# Patient Record
Sex: Female | Born: 1960 | Hispanic: No | Marital: Married | Smoking: Current every day smoker
Health system: Southern US, Community
[De-identification: ages and names within clinical notes are randomized; demographics above are authoritative.]

## PROBLEM LIST (undated history)

## (undated) DIAGNOSIS — M8580 Other specified disorders of bone density and structure, unspecified site: Secondary | ICD-10-CM

## (undated) DIAGNOSIS — E079 Disorder of thyroid, unspecified: Secondary | ICD-10-CM

## (undated) DIAGNOSIS — E785 Hyperlipidemia, unspecified: Secondary | ICD-10-CM

## (undated) DIAGNOSIS — E119 Type 2 diabetes mellitus without complications: Secondary | ICD-10-CM

## (undated) DIAGNOSIS — D649 Anemia, unspecified: Secondary | ICD-10-CM

## (undated) DIAGNOSIS — M1990 Unspecified osteoarthritis, unspecified site: Secondary | ICD-10-CM

## (undated) HISTORY — DX: Unspecified osteoarthritis, unspecified site: M19.90

## (undated) HISTORY — DX: Disorder of thyroid, unspecified: E07.9

## (undated) HISTORY — PX: CERVICAL SPINE SURGERY: SHX589

## (undated) HISTORY — DX: Type 2 diabetes mellitus without complications: E11.9

## (undated) HISTORY — DX: Other specified disorders of bone density and structure, unspecified site: M85.80

## (undated) HISTORY — DX: Hyperlipidemia, unspecified: E78.5

## (undated) HISTORY — DX: Anemia, unspecified: D64.9

---

## 1985-06-14 HISTORY — PX: BUNIONECTOMY: SHX129

## 2012-07-20 LAB — COMPLETE METABOLIC PANEL WITH GFR
ALBUMIN/GLOB SERPL: 1.5
ALT: 10 U/L (ref 7–35)
AST: 16 U/L
Albumin: 4.4
Alkaline Phosphatase: 99 U/L
BASOS ABS: 47 /uL
BILIRUBIN, TOTAL: 0.3
BUN: 10 mg/dL (ref 4–21)
Baso % Manual: 0.6
CHLORIDE: 108 mmol/L
CO2: 24 mmol/L
Calcium: 9.2 mg/dL
Chol/HDL Ratio: 3.3
Creat: 0.75
EOSINOPHIL PERCENT: 1.4
Eosinophils Absolute: 111 /uL
GFR, EST AFRICAN AMERICAN: 108
GFR, EST NON AFRICAN AMERICAN: 94
Globulin: 2.9
Glucose: 117
HEMOGLOBIN A1C: 7.4 % — AB (ref 4.0–6.0)
Lymphocytes relative %: 34 % (ref 15–45)
Monocytes relative %: 4.2 % (ref 2–10)
NONHDL CHOLESTEROL: 126
Neutrophils relative % (GR): 59.8 % (ref 44–76)
POTASSIUM: 4.7 mmol/L
Sodium: 141 mmol/L (ref 137–147)
TOTAL PROTEIN: 7.3 g/dL

## 2012-07-20 LAB — CBC WITH DIFFERENTIAL
HCT: 31 %
Hemoglobin: 10.1 g/dL — AB (ref 11.8–15.5)
LYMPHS ABS: 2686
MCH: 25.8
MCHC: 32.5
MCV: 79.3 fL (ref 78–100)
Monocytes(Absolute): 332
NEUTROS ABS: 4724
PLATELET COUNT: 181
RBC: 3.94
RDW: 17.7
WBC: 7.9

## 2012-07-20 LAB — URINALYSIS, COMPLETE
BACTERIA: NONE SEEN
BILIRUBIN: NEGATIVE
Glucose: NEGATIVE
HYALINE CASTS UA: NONE SEEN
Ketones: NEGATIVE
Leukocyte Esterase: NEGATIVE
Nitrite: NEGATIVE
Occult Blood: NEGATIVE
PH: 6.5
Protein: NEGATIVE
SPECIFIC GRAVITY: 1.007
SQUAM EPITHEL UA: NONE SEEN
WBC: NONE SEEN

## 2012-07-20 LAB — LIPID PANEL
CHOLESTEROL, TOTAL: 181
HDL: 55 mg/dL (ref 35–70)
LDL Cholesterol: 108 mg/dL
Magnesium: 2.1 mg/dL (ref 1.6–2.4)
TRIGLYCERIDES: 89
TSH: 2.34
Vitamin D, 25-OH, Total: 11

## 2012-07-20 LAB — ALBUMIN, URINE, RANDOM
Creatinine Random, Urine: 43
MICROALB/CREAT RATIO: 5
MICROALBUM., U, RANDOM: 0.2

## 2014-01-15 ENCOUNTER — Telehealth: Payer: Self-pay | Admitting: *Deleted

## 2014-01-15 ENCOUNTER — Encounter: Payer: Self-pay | Admitting: Nurse Practitioner

## 2014-01-15 ENCOUNTER — Ambulatory Visit (INDEPENDENT_AMBULATORY_CARE_PROVIDER_SITE_OTHER): Payer: Medicare Other | Admitting: Nurse Practitioner

## 2014-01-15 VITALS — BP 112/80 | HR 64 | Temp 97.9°F | Ht 65.0 in | Wt 158.0 lb

## 2014-01-15 DIAGNOSIS — D638 Anemia in other chronic diseases classified elsewhere: Secondary | ICD-10-CM | POA: Insufficient documentation

## 2014-01-15 DIAGNOSIS — M150 Primary generalized (osteo)arthritis: Secondary | ICD-10-CM

## 2014-01-15 DIAGNOSIS — M79609 Pain in unspecified limb: Secondary | ICD-10-CM

## 2014-01-15 DIAGNOSIS — M79604 Pain in right leg: Secondary | ICD-10-CM | POA: Insufficient documentation

## 2014-01-15 DIAGNOSIS — Z0000 Encounter for general adult medical examination without abnormal findings: Secondary | ICD-10-CM

## 2014-01-15 DIAGNOSIS — E559 Vitamin D deficiency, unspecified: Secondary | ICD-10-CM

## 2014-01-15 DIAGNOSIS — G589 Mononeuropathy, unspecified: Secondary | ICD-10-CM

## 2014-01-15 DIAGNOSIS — E538 Deficiency of other specified B group vitamins: Secondary | ICD-10-CM

## 2014-01-15 DIAGNOSIS — M545 Low back pain, unspecified: Secondary | ICD-10-CM

## 2014-01-15 DIAGNOSIS — M79605 Pain in left leg: Secondary | ICD-10-CM | POA: Insufficient documentation

## 2014-01-15 DIAGNOSIS — E039 Hypothyroidism, unspecified: Secondary | ICD-10-CM

## 2014-01-15 DIAGNOSIS — E119 Type 2 diabetes mellitus without complications: Secondary | ICD-10-CM

## 2014-01-15 DIAGNOSIS — G629 Polyneuropathy, unspecified: Secondary | ICD-10-CM

## 2014-01-15 DIAGNOSIS — M159 Polyosteoarthritis, unspecified: Secondary | ICD-10-CM

## 2014-01-15 LAB — IRON AND TIBC
%SAT: 33 % (ref 20–55)
Iron: 116 ug/dL (ref 42–145)
TIBC: 347 ug/dL (ref 250–470)
UIBC: 231 ug/dL (ref 125–400)

## 2014-01-15 MED ORDER — METFORMIN HCL 850 MG PO TABS: 850.0000 mg | ORAL_TABLET | Freq: Three times a day (TID) | ORAL | Status: DC

## 2014-01-15 MED ORDER — TIZANIDINE HCL 2 MG PO TABS: ORAL_TABLET | ORAL | Status: DC

## 2014-01-15 MED ORDER — HYDROCODONE-ACETAMINOPHEN 5-325 MG PO TABS: 1.0000 | ORAL_TABLET | Freq: Three times a day (TID) | ORAL | Status: DC

## 2014-01-15 MED ORDER — GABAPENTIN (ONCE-DAILY) 300 MG PO TABS: 300.0000 mg | ORAL_TABLET | Freq: Every day | ORAL | Status: DC

## 2014-01-15 MED ORDER — CELECOXIB 200 MG PO CAPS: 200.0000 mg | ORAL_CAPSULE | Freq: Every day | ORAL | Status: DC

## 2014-01-15 NOTE — Patient Instructions (Addendum)
Please see pain management. I will refer to vascular surgeon once I receive records. Start celebrex daily for arthritis pain.  My office will call with lab results. I will refill thyroid medicine & B12 once I receive lab results.  Please return in 2 to 3 weeks.  Nice to meet you!

## 2014-01-15 NOTE — Progress Notes (Signed)
Pre visit review using our clinic review tool, if applicable. No additional management support is needed unless otherwise documented below in the visit note. 

## 2014-01-16 LAB — CBC WITH DIFFERENTIAL/PLATELET
Basophils Absolute: 0 10*3/uL (ref 0.0–0.1)
Basophils Relative: 0.2 % (ref 0.0–3.0)
EOS ABS: 0.1 10*3/uL (ref 0.0–0.7)
Eosinophils Relative: 1.6 % (ref 0.0–5.0)
HCT: 41.2 % (ref 36.0–46.0)
Hemoglobin: 14.2 g/dL (ref 12.0–15.0)
Lymphocytes Relative: 30.1 % (ref 12.0–46.0)
Lymphs Abs: 2.3 10*3/uL (ref 0.7–4.0)
MCHC: 34.5 g/dL (ref 30.0–36.0)
MCV: 95 fl (ref 78.0–100.0)
MONO ABS: 0.3 10*3/uL (ref 0.1–1.0)
Monocytes Relative: 3.6 % (ref 3.0–12.0)
NEUTROS PCT: 64.5 % (ref 43.0–77.0)
Neutro Abs: 4.9 10*3/uL (ref 1.4–7.7)
Platelets: 181 10*3/uL (ref 150.0–400.0)
RBC: 4.33 Mil/uL (ref 3.87–5.11)
RDW: 12.4 % (ref 11.5–15.5)
WBC: 7.7 10*3/uL (ref 4.0–10.5)

## 2014-01-16 LAB — LIPID PANEL
Cholesterol: 206 mg/dL — ABNORMAL HIGH (ref 0–200)
HDL: 51.2 mg/dL (ref >39.00)
LDL Cholesterol: 134 mg/dL — ABNORMAL HIGH (ref 0–99)
NonHDL: 154.8
Total CHOL/HDL Ratio: 4
Triglycerides: 106 mg/dL (ref 0.0–149.0)
VLDL: 21.2 mg/dL (ref 0.0–40.0)

## 2014-01-16 LAB — FERRITIN: Ferritin: 17 ng/mL (ref 10.0–291.0)

## 2014-01-16 LAB — HEMOGLOBIN A1C: HEMOGLOBIN A1C: 8 % — AB (ref 4.6–6.5)

## 2014-01-16 LAB — COMPREHENSIVE METABOLIC PANEL
ALT: 59 U/L — ABNORMAL HIGH (ref 0–35)
AST: 36 U/L (ref 0–37)
Albumin: 4.3 g/dL (ref 3.5–5.2)
Alkaline Phosphatase: 86 U/L (ref 39–117)
BUN: 11 mg/dL (ref 6–23)
CHLORIDE: 104 meq/L (ref 96–112)
CO2: 28 meq/L (ref 19–32)
Calcium: 9.3 mg/dL (ref 8.4–10.5)
Creatinine, Ser: 0.7 mg/dL (ref 0.4–1.2)
GFR: 88.03 mL/min (ref >60.00)
Glucose, Bld: 155 mg/dL — ABNORMAL HIGH (ref 70–99)
Potassium: 4.3 mEq/L (ref 3.5–5.1)
SODIUM: 138 meq/L (ref 135–145)
TOTAL PROTEIN: 7.1 g/dL (ref 6.0–8.3)
Total Bilirubin: 0.8 mg/dL (ref 0.2–1.2)

## 2014-01-16 LAB — VITAMIN D 25 HYDROXY (VIT D DEFICIENCY, FRACTURES): VITD: 53.84 ng/mL (ref 30.00–100.00)

## 2014-01-16 LAB — VITAMIN B12: Vitamin B-12: 865 pg/mL (ref 211–911)

## 2014-01-16 LAB — TSH: TSH: 0.68 u[IU]/mL (ref 0.35–4.50)

## 2014-01-17 ENCOUNTER — Encounter: Payer: Self-pay | Admitting: Nurse Practitioner

## 2014-01-17 DIAGNOSIS — M549 Dorsalgia, unspecified: Secondary | ICD-10-CM | POA: Insufficient documentation

## 2014-01-17 DIAGNOSIS — Z0000 Encounter for general adult medical examination without abnormal findings: Secondary | ICD-10-CM | POA: Insufficient documentation

## 2014-01-17 NOTE — Assessment & Plan Note (Addendum)
Takes Norco 5/325 1T tid, gabapentin 300 mg qhs, & tizanadine 2md 1 1/2 T tid. Will prescribe 30 days. Refer to pain management.

## 2014-01-17 NOTE — Assessment & Plan Note (Signed)
Check B12 today. 

## 2014-01-17 NOTE — Assessment & Plan Note (Signed)
Continue meds. A1C, lipids, Cmet today.

## 2014-01-17 NOTE — Progress Notes (Signed)
Subjective:     Shelia Leon is a 53 y.o. female presents to establish care. She recently re-located from [place]. She is currently treated for DM, hypothyroidism, chronic pain & neuropathy, anemia, hyperlipidemia, and she is a smoker. She brings an OV note from PCP dated 09/28/2013, labs dated 07/20/2012, and colonoscopy report dated 07/02/13. All notes reviewed & Copied, originals returned to patient.  Shelia Leon walks with the aid of a cane. She is disabled. She has no new complaints today, wishes to resume care for chronic conditions.  The following portions of the patient's history were reviewed and updated as appropriate: allergies, current medications, past medical history, past social history, past surgical history and problem list.  Review of Systems Pertinent items are noted in HPI.    Objective:    BP 112/80  Pulse 64  Temp(Src) 97.9 F (36.6 C) (Oral)  Ht 5\' 5"  (1.651 m)  Wt 158 lb (71.668 kg)  BMI 26.29 kg/m2  SpO2 96%  LMP 01/15/2013 BP 112/80  Pulse 64  Temp(Src) 97.9 F (36.6 C) (Oral)  Ht 5\' 5"  (1.651 m)  Wt 158 lb (71.668 kg)  BMI 26.29 kg/m2  SpO2 96%  LMP 01/15/2013 General appearance: alert, cooperative, appears stated age, no distress and walks with cane Head: Normocephalic, without obvious abnormality, atraumatic Eyes: negative findings: lids and lashes normal and conjunctivae and sclerae normal Ears: normal TM's and external ear canals both ears Throat: lips, mucosa, and tongue normal; teeth and gums normal Lungs: clear to auscultation bilaterally Heart: regular rate and rhythm, S1, S2 normal, no murmur, click, rub or gallop Extremities: extremities normal, atraumatic, no cyanosis or edema Pulses: 2+ and symmetric    Assessment:   1. Unspecified vitamin D deficiency - Vit D  25 hydroxy (rtn osteoporosis monitoring)  2. Unspecified hypothyroidism - TSH  3. Type 2 diabetes mellitus without complication - Hemoglobin A1c - Lipid panel - Comprehensive  metabolic panel - metFORMIN (GLUCOPHAGE) 850 MG tablet; Take 1 tablet (850 mg total) by mouth 3 (three) times daily.  Dispense: 90 tablet; Refill: 1  4. Anemia, chronic disease - CBC with Differential - Ferritin - Iron and TIBC  5. B12 deficiency - Vitamin B12  6. Neuropathy - tiZANidine (ZANAFLEX) 2 MG tablet; Take 1 to 2 Tablets PO 3 tid PRN  Dispense: 180 tablet; Refill: 0 - Gabapentin, PHN, 300 MG TABS; Take 300 mg by mouth at bedtime.  Dispense: 30 tablet; Refill: 1 - HYDROcodone-acetaminophen (NORCO/VICODIN) 5-325 MG per tablet; Take 1 tablet by mouth 3 (three) times daily.  Dispense: 90 tablet; Refill: 0 - Ambulatory referral to Pain Clinic  7. Primary osteoarthritis involving multiple joints - celecoxib (CELEBREX) 200 MG capsule; Take 1 capsule (200 mg total) by mouth daily.  Dispense: 30 capsule; Refill: 1  8. Leg pain, bilateral  9. Bilateral low back pain, with sciatica presence unspecified  See problem list for complete A&P See pt instructions. F/u 1 mo.-celebrex.

## 2014-01-17 NOTE — Assessment & Plan Note (Signed)
TSH today. Continue meds.

## 2014-01-17 NOTE — Assessment & Plan Note (Signed)
Will prescribe 30 days worth of above meds. Ref to pain management.

## 2014-01-18 ENCOUNTER — Other Ambulatory Visit: Payer: Self-pay | Admitting: Nurse Practitioner

## 2014-01-18 ENCOUNTER — Telehealth: Payer: Self-pay | Admitting: *Deleted

## 2014-01-18 DIAGNOSIS — E039 Hypothyroidism, unspecified: Secondary | ICD-10-CM

## 2014-01-18 MED ORDER — LEVOTHYROXINE SODIUM 125 MCG PO TABS: 125.0000 ug | ORAL_TABLET | Freq: Every day | ORAL | Status: DC

## 2014-01-18 NOTE — Telephone Encounter (Signed)
Patient called office requesting her lab results. Please advise?

## 2014-01-18 NOTE — Telephone Encounter (Signed)
pls call pt: Advise No anemia. No change in thyroid meds. Will refill med. A1C increased to 8.0 from 7.4.  Make OV within week to discuss diet changes or more meds. Liver enzyme slightly elevated. Vit D & B12 good. All other labs nml.

## 2014-01-18 NOTE — Telephone Encounter (Signed)
Patient notified of lab results. Patient expressed understanding. Scheduled appt for 01/21/14.

## 2014-01-21 ENCOUNTER — Encounter: Payer: Self-pay | Admitting: Nurse Practitioner

## 2014-01-21 ENCOUNTER — Ambulatory Visit (INDEPENDENT_AMBULATORY_CARE_PROVIDER_SITE_OTHER): Payer: Medicare Other | Admitting: Nurse Practitioner

## 2014-01-21 VITALS — BP 113/70 | HR 66 | Temp 97.6°F | Ht 65.0 in | Wt 158.0 lb

## 2014-01-21 DIAGNOSIS — E119 Type 2 diabetes mellitus without complications: Secondary | ICD-10-CM

## 2014-01-21 DIAGNOSIS — M150 Primary generalized (osteo)arthritis: Secondary | ICD-10-CM

## 2014-01-21 DIAGNOSIS — M159 Polyosteoarthritis, unspecified: Secondary | ICD-10-CM

## 2014-01-21 DIAGNOSIS — E538 Deficiency of other specified B group vitamins: Secondary | ICD-10-CM

## 2014-01-21 DIAGNOSIS — D638 Anemia in other chronic diseases classified elsewhere: Secondary | ICD-10-CM

## 2014-01-21 DIAGNOSIS — M2550 Pain in unspecified joint: Secondary | ICD-10-CM

## 2014-01-21 MED ORDER — NAPROXEN 500 MG PO TABS: 500.0000 mg | ORAL_TABLET | Freq: Every day | ORAL | Status: DC

## 2014-01-21 NOTE — Progress Notes (Signed)
Subjective:     Shelia Leon is an 53 y.o. female who presents for follow up of diabetes. Last A1C increased to 8.0 from 7.4. Current symptoms include: none. Patient denies foot ulcerations, hyperglycemia, hypoglycemia , increased appetite, nausea, polydipsia and polyuria. Evaluation to date has included: hemoglobin A1C. Current treatments: 850 mg tid metformin which has been ineffective. Last dilated eye exam within year. Lengthy discussion about diet & exercise changes. Pt requests handicap plate & plaquard. Forms filled out. Reviewed recent labs: elevated ALT, B12, vit D.  The following portions of the patient's history were reviewed and updated as appropriate: allergies, current medications, past family history, past medical history, past social history, past surgical history and problem list.  Review of Systems Pertinent items are noted in HPI.    Objective:    BP 113/70  Pulse 66  Temp(Src) 97.6 F (36.4 C) (Temporal)  Ht 5\' 5"  (1.651 m)  Wt 158 lb (71.668 kg)  BMI 26.29 kg/m2  SpO2 98%  LMP 01/15/2013 General appearance: alert, cooperative, appears stated age and no distress Head: Normocephalic, without obvious abnormality, atraumatic Eyes: negative findings: lids and lashes normal and conjunctivae and sclerae normal  Laboratory: No components found with this basename: A1C      Assessment:   1. Arthralgia - naproxen (NAPROSYN) 500 MG tablet; Take 1 tablet (500 mg total) by mouth daily with lunch.  Dispense: 30 tablet; Refill: 2  2. Vitamin B 12 deficiency - Vitamin B12; Future  3. Type 2 diabetes mellitus without complication  4. Primary osteoarthritis involving multiple joints  5. Anemia, chronic disease Resolved. 6. B12 deficiency  See problem list for complete A&P See pt instructions. Lab appt 3 wks for B12. F/u OV 3 mos.

## 2014-01-21 NOTE — Assessment & Plan Note (Signed)
Last inj 1 week ago. Trial with oral b12 only. Check b12 in 3 weeks. If not dropped, use oral b12 2400 mcg qd., o/w will continue injections.

## 2014-01-21 NOTE — Assessment & Plan Note (Signed)
Start naproxen 500 mg qd w/lunch. F/u 3 mos.

## 2014-01-21 NOTE — Assessment & Plan Note (Signed)
Diet changes: no corn syrup & high fructose corn syrups. Make tea at home, sweeten w/splenda. No coffee creamers-use splenda & milk. Whole grains only-must have 4 gm fiber or more /srvng. No refined sugars. Meat 3-4 times/wk. Eat fruits & vegetables with every meal. Walk 10 minutes after every meal. A1C in 3 mos.

## 2014-01-21 NOTE — Patient Instructions (Signed)
Cut out refined sugar:anything that is sweet when you eat or drink it except fresh fruit. No corn syrup or high fructose corn syrups.  Cut out white bread, rolls, biscuits, bagels, muffins, pasta and cereals. Breads & cereals that have 4 gm or more of fiber per serving are good. Whole wheat pasta, brown rice and quinoa are good choices. Roasted potatoes with peeling are good. Make your own tea, add fresh lemon juice, use splenda to sweeten. Avoid coffee creamers-use milk & splenda instead. Eat fruits & vegetables with every meal. Limit meat to 3-4 times/week.  Walk or move (dance, bike, treadmill) for at least 10 minutes after every meal.  Make these changes for 3 mos & I will check A1C again. Hopefully you can avoid more medicine.  Decrease Vitamin D to every other day.  Stop B12 injections, return in 3 weeks to check level, you may be able to take B12 tabs only. You should get 2400 mcg or 2 mg daily.  Start naproxen with food daily.

## 2014-01-21 NOTE — Progress Notes (Signed)
Pre visit review using our clinic review tool, if applicable. No additional management support is needed unless otherwise documented below in the visit note. 

## 2014-01-21 NOTE — Assessment & Plan Note (Signed)
Anemia is corrected. Reason unknown for historical anemia: possibly B12 deficiency.  Iron studies nml.

## 2014-01-22 ENCOUNTER — Encounter: Payer: Self-pay | Admitting: *Deleted

## 2014-01-22 NOTE — Telephone Encounter (Signed)
Started PA for gabapentin phn, 300 mg 1 po qhs.

## 2014-02-07 ENCOUNTER — Other Ambulatory Visit (INDEPENDENT_AMBULATORY_CARE_PROVIDER_SITE_OTHER): Payer: Medicare Other

## 2014-02-07 ENCOUNTER — Telehealth: Payer: Self-pay

## 2014-02-07 ENCOUNTER — Other Ambulatory Visit: Payer: Medicare Other

## 2014-02-07 ENCOUNTER — Ambulatory Visit: Payer: Medicare Other | Admitting: Nurse Practitioner

## 2014-02-07 DIAGNOSIS — E538 Deficiency of other specified B group vitamins: Secondary | ICD-10-CM

## 2014-02-07 LAB — VITAMIN B12: Vitamin B-12: 856 pg/mL (ref 211–911)

## 2014-02-07 NOTE — Telephone Encounter (Signed)
Pt came in for bloodwork and wanted to know if Layne could write her a letter for her apartment. She wants it to say that she needs a low level apartment with assisting bars and a high toilet due to her medical condition. Please advise.

## 2014-02-11 ENCOUNTER — Encounter: Payer: Self-pay | Admitting: Nurse Practitioner

## 2014-02-11 NOTE — Telephone Encounter (Signed)
pls draft letter  "to Whom it May Concern"  Stating "Due to medical condition that places Ms. Bateson at risk for falls, 1st level apartment with high toilet seat and grab bar at toilet & tub are necessary to minimize risk for falls."  Please have me sign ,then mail to patient.

## 2014-02-11 NOTE — Telephone Encounter (Signed)
Letter done

## 2014-02-11 NOTE — Telephone Encounter (Signed)
Letter mailed to pt residence

## 2014-02-12 ENCOUNTER — Encounter: Payer: Self-pay | Admitting: Nurse Practitioner

## 2014-02-20 ENCOUNTER — Telehealth: Payer: Self-pay | Admitting: Nurse Practitioner

## 2014-02-20 NOTE — Telephone Encounter (Signed)
Patient is inquiring about a referral to a vascular surgeon. Patient also wants to know when she should do a follow-up visit at our office.

## 2014-02-21 NOTE — Telephone Encounter (Signed)
Left message for pt to call back  °

## 2014-02-21 NOTE — Telephone Encounter (Signed)
I reviewed her records. There were no vascular studies, only nerve-conduction velocity testing. Does she wasn't to see a neurologist? She should have f/u visit early November, Pls schedule.

## 2014-02-22 ENCOUNTER — Telehealth: Payer: Self-pay

## 2014-02-22 NOTE — Telephone Encounter (Signed)
Pt wants the referral to the neurologist.

## 2014-02-22 NOTE — Telephone Encounter (Signed)
Spoke with pt, she states she needs a Physiological scientist and feels frustrated that she had a procedure planned in the city where she moved from and now she is unable to proceed with it. She is going to make an appt with [place] at [facility].

## 2014-02-26 ENCOUNTER — Other Ambulatory Visit: Payer: Self-pay | Admitting: Nurse Practitioner

## 2014-02-26 DIAGNOSIS — G5793 Unspecified mononeuropathy of bilateral lower limbs: Secondary | ICD-10-CM

## 2014-02-26 NOTE — Progress Notes (Signed)
Patient aware of neurology referral.

## 2014-03-15 ENCOUNTER — Ambulatory Visit (INDEPENDENT_AMBULATORY_CARE_PROVIDER_SITE_OTHER): Payer: Medicare Other | Admitting: Neurology

## 2014-03-15 ENCOUNTER — Encounter: Payer: Self-pay | Admitting: Neurology

## 2014-03-15 VITALS — BP 110/70 | HR 53 | Ht 65.0 in | Wt 158.6 lb

## 2014-03-15 DIAGNOSIS — E1149 Type 2 diabetes mellitus with other diabetic neurological complication: Secondary | ICD-10-CM

## 2014-03-15 DIAGNOSIS — R269 Unspecified abnormalities of gait and mobility: Secondary | ICD-10-CM

## 2014-03-15 DIAGNOSIS — Z720 Tobacco use: Secondary | ICD-10-CM

## 2014-03-15 DIAGNOSIS — F17200 Nicotine dependence, unspecified, uncomplicated: Secondary | ICD-10-CM

## 2014-03-15 DIAGNOSIS — G894 Chronic pain syndrome: Secondary | ICD-10-CM

## 2014-03-15 MED ORDER — GABAPENTIN 300 MG PO CAPS: 300.0000 mg | ORAL_CAPSULE | Freq: Three times a day (TID) | ORAL | Status: DC

## 2014-03-15 NOTE — Progress Notes (Signed)
[facility] Neurology [facility] Clinic Note - Initial Visit   Date: 03/15/2014  Shelia Leon MRN: [id number] DOB: 18-Nov-1960   Dear Maximino Sarin, NP:  Thank you for your kind referral of Shelia Leon for consultation of neuropathy. Although her history is well known to you, please allow [place] to reiterate it for the purpose of our medical record. The patient was accompanied to the clinic by husband who also provides collateral information.     History of Present Illness: Shelia Leon is a 53 y.o. right-handed Caucasian female with history of diabetic neuropathy (HbA1c 8.0, diagnosed 2001), hyperlipidemia, hypothyroidism, cervical spondylosis s/p anterior decompression and fusion, current tobacco use, and vitamin B12 deficiency presenting for evaluation of neuropathy.    In 2011, she was hospitalized because of behavior changes described as not eating, not talking, combative, "unresponsive", but no loss of consciousness.  She does not recall the details well, but reports she was "even eating flowers" She reports not getting out of the bed for 3 weeks and was eventually discharged to in-patient rehab and was discharged with a walker.  After completing out-patient therapy, she used a cane which she has been using since then.    No similar spells since then.  Around the same time, she developed developed gait unsteadiness and numbness of the feet with intermittent stabbing pain. She also has severe painful leg cramps. Denies any exacerbating or alleviating factors.  She is currently taking gabapentin 300mg  at bedtime and vocidin.  She recently moved from [place], [place] in June 2015 and was seeing Dr. Earlene Plater, neurologist.  She had NCS/EMG which did not show evidence of large fiber neuropathy, although there was active changes in the S1 myotomes.  Subsequent MRI lumbar spine did not show any abnormalities.    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   Out-side paper records, electronic medical record, and images have been reviewed where available and summarized as:  MRI lumbar spine 11/02/2012:  Unremarkable SSEP 07/09/2013:  normal  Lab Results  Component Value Date   HGBA1C 8.0* 01/15/2014   Lab Results  Component Value Date   TSH 0.68 01/15/2014   Lab Results  Component Value Date   VITAMINB12 856 02/07/2014      Past Medical History  Diagnosis Date  . Anemia   . Diabetes mellitus without complication   . Hyperlipidemia   . Thyroid disease     Past Surgical History  Procedure Laterality Date  . Cervical spine surgery      hardware     Medications:  Current Outpatient Prescriptions on File Prior to Visit  Medication Sig Dispense Refill  . Alcohol Swabs (ALCOHOL PREPS) PADS by Does not apply route.      Marland Kitchen atorvastatin (LIPITOR) 40 MG tablet Take 40 mg by mouth at bedtime.      . Cholecalciferol (VITAMIN D3) 2000 UNITS TABS Take 2,000 Units by mouth daily.      . clotrimazole (LOTRIMIN) 1 % cream Apply 1 application topically 2 (two) times daily.      . cyanocobalamin 1000 MCG tablet Take 100 mcg by mouth daily.      Marland Kitchen docusate sodium (COLACE) 100 MG capsule Take 100 mg by mouth 2 (two) times daily.      . Gabapentin, PHN, 300 MG TABS Take 300 mg by mouth at  bedtime.  30 tablet  1  . glucose blood test strip 1 each by Other route 3 (three) times daily as needed for other. Reagent Test Strips      . HYDROcodone-acetaminophen (NORCO/VICODIN) 5-325 MG per tablet Take 1 tablet by mouth 3 (three) times daily.  90 tablet  0  . Lancets MISC by Does not apply route 3 (three) times daily as needed.      Marland Kitchen levothyroxine (LEVOTHROID) 125 MCG tablet Take 1 tablet (125 mcg total) by mouth daily before breakfast.  30 tablet  5  . lisinopril (PRINIVIL,ZESTRIL) 2.5 MG tablet Take 2.5  mg by mouth daily.      . metFORMIN (GLUCOPHAGE) 850 MG tablet Take 1 tablet (850 mg total) by mouth 3 (three) times daily.  90 tablet  1  . naproxen (NAPROSYN) 500 MG tablet Take 1 tablet (500 mg total) by mouth daily with lunch.  30 tablet  2  . terbinafine (LAMISIL) 250 MG tablet Take 250 mg by mouth daily.      Marland Kitchen tiZANidine (ZANAFLEX) 2 MG tablet Take 1 to 2 Tablets PO 3 tid PRN  180 tablet  0   No current facility-administered medications on file prior to visit.    Allergies:  Allergies  Allergen Reactions  . Penicillins     Family History: Family History  Problem Relation Age of Onset  . Alcohol abuse Mother   . Arthritis Mother   . Heart disease Mother   . Alcohol abuse Father   . Arthritis Father   . Heart disease Father   . Diabetes Daughter   . Heart disease Sister   . Diabetes Brother   . Diabetes Brother   . Diabetes Brother   . Diabetes Brother   . Heart disease Brother     Social History: History   Social History  . Marital Status: Married    Spouse Name: N/A    Number of Children: N/A  . Years of Education: N/A   Occupational History  . Not on file.   Social History Main Topics  . Smoking status: Current Every Day Smoker -- 0.40 packs/day for 34 years    Types: Cigarettes  . Smokeless tobacco: Never Used  . Alcohol Use: No  . Drug Use: No  . Sexual Activity: Yes    Birth Control/ Protection: Post-menopausal   Other Topics Concern  . Not on file   Social History Narrative   Lives with husband in a 2 story house.  States that it is very painful going up and down stairs.   Sleeps upstairs so she only makes one trip a day up at bedtime.      High school education.    Review of Systems:  CONSTITUTIONAL: No fevers, chills, night sweats, or weight loss.   EYES: No visual changes or eye pain ENT: No hearing changes.  No history of nose bleeds.   RESPIRATORY: No cough, wheezing and shortness of breath.   CARDIOVASCULAR: Negative for chest pain,  and palpitations.   GI: Negative for abdominal discomfort, blood in stools or black stools.  No recent change in bowel habits.   GU:  No history of incontinence.   MUSCLOSKELETAL: +history of joint pain or swelling.  +myalgias.   SKIN: Negative for lesions, rash, and itching.   HEMATOLOGY/ONCOLOGY: Negative for prolonged bleeding, bruising easily, and swollen nodes.  ENDOCRINE: Negative for cold or heat intolerance, polydipsia or goiter.   PSYCH:  +depression or anxiety symptoms.   NEURO:  As Above.   Vital Signs:  BP 110/70  Pulse 53  Ht 5\' 5"  (1.651 m)  Wt 158 lb 9 oz (71.923 kg)  BMI 26.39 kg/m2  SpO2 96%   General Medical Exam:   General:  Well appearing, comfortable.   Eyes/ENT: see cranial nerve examination.   Neck: No masses appreciated.  Full range of motion without tenderness.  No carotid bruits. Respiratory:  Clear to auscultation, good air entry bilaterally.   Cardiac:  Regular rate and rhythm, no murmur.   Back:  No pain to palpation of spinous processes.   Extremities:  No deformities, edema, or skin discoloration. Good capillary refill.   Skin:  Skin color, texture, turgor normal. No rashes or lesions.  Neurological Exam: MENTAL STATUS including orientation to time, place, person, recent and remote memory, attention span and concentration, language, and fund of knowledge is normal.  Speech is not dysarthric.  CRANIAL NERVES: II:  No visual field defects.  Unremarkable fundi.   III-IV-VI: Pupils equal round and reactive to light.  Normal conjugate, extra-ocular eye movements in all directions of gaze.  No nystagmus.  No ptosis.   V:  Normal facial sensation.     VII:  Normal facial symmetry and movements.   VIII:  Normal hearing and vestibular function.   IX-X:  Normal palatal movement.   XI:  Normal shoulder shrug and head rotation.   XII:  Normal tongue strength and range of motion, no deviation or fasciculation.  MOTOR:  No atrophy, fasciculations or abnormal  movements.  No pronator drift.  Tone is normal.    Right Upper Extremity:    Left Upper Extremity:    Deltoid  5/5   Deltoid  5/5   Biceps  5/5   Biceps  5/5   Triceps  5/5   Triceps  5/5   Wrist extensors  5/5   Wrist extensors  5/5   Wrist flexors  5/5   Wrist flexors  5/5   Finger extensors  5/5   Finger extensors  5/5   Finger flexors  5/5   Finger flexors  5/5   Dorsal interossei  5/5   Dorsal interossei  5/5   Abductor pollicis  5/5   Abductor pollicis  5/5   Tone (Ashworth scale)  0  Tone (Ashworth scale)  0   Right Lower Extremity:    Left Lower Extremity:    Hip flexors  5/5   Hip flexors  5/5   Hip extensors  5/5   Hip extensors  5/5   Knee flexors  5/5   Knee flexors  5/5   Knee extensors  5/5   Knee extensors  5/5   Dorsiflexors  5/5   Dorsiflexors  5/5   Plantarflexors  5/5   Plantarflexors  5/5   Toe extensors  5/5   Toe extensors  5/5   Toe flexors  5/5   Toe flexors  5/5   Tone (Ashworth scale)  0  Tone (Ashworth scale)  0   MSRs:  Right                                                                 Left brachioradialis 2+  brachioradialis 2+  biceps 2+  biceps 2+  triceps  2+  triceps 2+  patellar 2+  patellar 2+  ankle jerk 2+  ankle jerk 2+  Hoffman no  Hoffman no  plantar response down  plantar response down   SENSORY:  Normal and symmetric perception of light touch, pinprick, vibration, and proprioception.  Romberg's sign absent.   COORDINATION/GAIT: Normal finger-to- nose-finger and heel-to-shin.  Intact rapid alternating movements bilaterally.  Able to rise from a chair without using arms.  Gait appears wide-based, antalgic, somewhat nonphysiological at times.    IMPRESSION: 1.  Painful diabetic neuropathy  - Diabetes is poorly controlled, HbA1c 8.0 2.  Chronic back pain, most likely musculoskeletal  - MRI lumbar spine from 2014 does not show any abnormalities 3.  Tobacco use 4.  Gait abnormality, likely due to neuropathic pain in feet but also some  nonphysiologic quality  - May consider gait training once pain is better controlled   PLAN/RECOMMENDATIONS:  1.  Increase gabapentin to 300mg  three times daily 2.  Start water exercises three times per week 3.  Strongly encouraged to stop smoking 4.  Follow a low sugar, low-carbohydrate diet for diabetes.   5.  Return to clinic [id number]   The duration of this appointment visit was 60 minutes of face-to-face time with the patient.  Greater than 50% of this time was spent in counseling, explanation of diagnosis, planning of further management, and coordination of care.   Thank you for allowing me to participate in patient's care.  If I can answer any additional questions, I would be pleased to do so.    Sincerely,    Donika K. Allena KatzPatel, DO

## 2014-03-15 NOTE — Patient Instructions (Addendum)
1.  Increase gabapentin to 300mg  three times daily 2.  Start water exercises three times per week 3.  Try very hard to stop smoking 4.  Follow a low sugar, low-carbohydrate diet for diabetes.  Worsening diabetes = worsening neuropathy = worse pain. 5.  Return to clinic [id number]

## 2014-03-22 ENCOUNTER — Other Ambulatory Visit: Payer: Self-pay | Admitting: *Deleted

## 2014-03-22 DIAGNOSIS — G629 Polyneuropathy, unspecified: Secondary | ICD-10-CM

## 2014-03-22 DIAGNOSIS — E119 Type 2 diabetes mellitus without complications: Secondary | ICD-10-CM

## 2014-03-22 MED ORDER — TIZANIDINE HCL 2 MG PO TABS: ORAL_TABLET | ORAL | Status: DC

## 2014-03-22 MED ORDER — METFORMIN HCL 850 MG PO TABS: 850.0000 mg | ORAL_TABLET | Freq: Three times a day (TID) | ORAL | Status: DC

## 2014-03-22 NOTE — Telephone Encounter (Signed)
Refill request for tizanidine Last filled by MD on- 01/15/14 #180 x0 Last Appt: 01/21/2014 Next Appt: none Please advise refill?

## 2014-04-16 ENCOUNTER — Ambulatory Visit: Payer: Self-pay | Admitting: Neurology

## 2014-05-24 ENCOUNTER — Other Ambulatory Visit: Payer: Self-pay | Admitting: *Deleted

## 2014-05-24 DIAGNOSIS — G629 Polyneuropathy, unspecified: Secondary | ICD-10-CM

## 2014-05-24 DIAGNOSIS — E119 Type 2 diabetes mellitus without complications: Secondary | ICD-10-CM

## 2014-05-24 MED ORDER — TIZANIDINE HCL 2 MG PO TABS: ORAL_TABLET | ORAL | Status: DC

## 2014-05-24 MED ORDER — METFORMIN HCL 850 MG PO TABS: 850.0000 mg | ORAL_TABLET | Freq: Three times a day (TID) | ORAL | Status: DC

## 2014-05-24 NOTE — Telephone Encounter (Signed)
Refill request for Zanaflex Last filled by MD on- 03/22/14 #180 X0 Last Appt: 03/15/2014 Next Appt: none Please advise refill?

## 2014-06-17 ENCOUNTER — Ambulatory Visit (INDEPENDENT_AMBULATORY_CARE_PROVIDER_SITE_OTHER): Payer: Medicare Other | Admitting: Neurology

## 2014-06-17 ENCOUNTER — Encounter: Payer: Self-pay | Admitting: Neurology

## 2014-06-17 VITALS — BP 124/64 | HR 77 | Ht 65.5 in | Wt 163.2 lb

## 2014-06-17 DIAGNOSIS — R292 Abnormal reflex: Secondary | ICD-10-CM

## 2014-06-17 DIAGNOSIS — M6249 Contracture of muscle, multiple sites: Secondary | ICD-10-CM

## 2014-06-17 DIAGNOSIS — R29898 Other symptoms and signs involving the musculoskeletal system: Secondary | ICD-10-CM

## 2014-06-17 DIAGNOSIS — M62838 Other muscle spasm: Secondary | ICD-10-CM

## 2014-06-17 DIAGNOSIS — M79606 Pain in leg, unspecified: Secondary | ICD-10-CM

## 2014-06-17 MED ORDER — NORTRIPTYLINE HCL 10 MG PO CAPS: 10.0000 mg | ORAL_CAPSULE | Freq: Every day | ORAL | Status: DC

## 2014-06-17 NOTE — Patient Instructions (Signed)
1. Start nortriptyline  at bedtime 2.  Stop gabapentin  3. MRI thoracic spine wo contrast 4.  Strongly encouraged to stop smoking 5.  Return to clinic in 38-months

## 2014-06-17 NOTE — Progress Notes (Signed)
Follow-up Visit   Date: 06/17/2014    Shelia Leon MRN: [id number] DOB: [birth year]   Interim History: Shelia Leon is a 54 y.o. right-handed Caucasian female with diabetic neuropathy (HbA1c 8.0, diagnosed 2001), hyperlipidemia, hypothyroidism, cervical spondylosis s/p anterior decompression and fusion, current tobacco use, and vitamin B12 deficiency returning to the clinic for follow-up of bilateral leg pain and gait abnormality.  History of present illness: In 2011, she was hospitalized because of behavior changes described as not eating, not talking, combative, "unresponsive", but no loss of consciousness. She does not recall the details well, but reports she was "even eating flowers" She reports not getting out of the bed for 3 weeks and was eventually discharged to in-patient rehab and was discharged with a walker. After completing out-patient therapy, she used a cane which she has been using since then.   No similar spells since then. Around the same time, she developed developed gait unsteadiness and numbness of the feet with intermittent stabbing pain. She also has severe painful leg cramps. Denies any exacerbating or alleviating factors.  She is currently taking gabapentin  at bedtime and vocidin.  She moved from [place], [place] in June 2015 and was seeing Dr. Earlene Plater, neurologist. She had NCS/EMG which did not show evidence of large fiber neuropathy, although there was active changes in the S1 myotomes. Subsequent MRI lumbar spine did not show any abnormalities.   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  UPDATE 06/17/2013:  She tried increasing gabapentin to  twice daily but did not tolerate it at a higher dose, so it back down to taking it to one tablet at bedtime.  She continues to  have difficulty with walking, numbness, and spasms of her legs.  She has already seen pain management in [place] for her symptoms and does not want to see them again.     Medications:  Current Outpatient Prescriptions on File Prior to Visit  Medication Sig Dispense Refill  . Alcohol Swabs (ALCOHOL PREPS) PADS by Does not apply route.    Marland Kitchen atorvastatin (LIPITOR) 40 MG tablet Take 40 mg by mouth at bedtime.    . Cholecalciferol (VITAMIN D3) 2000 UNITS TABS Take 2,000 Units by mouth daily.    . clotrimazole (LOTRIMIN) 1 % cream Apply 1 application topically 2 (two) times daily.    . cyanocobalamin 1000 MCG tablet Take 100 mcg by mouth daily.    Marland Kitchen docusate sodium (COLACE) 100 MG capsule Take 100 mg by mouth 2 (two) times daily.    . Gabapentin, PHN, 300 MG TABS Take 300 mg by mouth at bedtime. 30 tablet 1  . glucose blood test strip 1 each by Other route 3 (three) times daily as needed for other. Reagent Test Strips    . HYDROcodone-acetaminophen (NORCO/VICODIN) 5-325 MG per tablet Take 1 tablet by mouth 3 (three) times daily. 90 tablet 0  . Lancets MISC by Does not apply route 3 (three) times daily as needed.    Marland Kitchen levothyroxine (LEVOTHROID) 125 MCG tablet Take 1 tablet (125 mcg total) by mouth daily before breakfast. 30 tablet 5  . lisinopril (PRINIVIL,ZESTRIL) 2.5 MG tablet Take 2.5 mg by mouth daily.    . metFORMIN (GLUCOPHAGE) 850 MG tablet Take 1 tablet (850 mg total) by mouth 3 (three) times daily. 90 tablet 1  . naproxen (NAPROSYN)  500 MG tablet Take 1 tablet (500 mg total) by mouth daily with lunch. 30 tablet 2  . tiZANidine (ZANAFLEX) 2 MG tablet Take 1 to 2 Tablets PO 3 tid PRN 180 tablet 1   No current facility-administered medications on file prior to visit.    Allergies:  Allergies  Allergen Reactions  . Penicillins     Review of Systems:  CONSTITUTIONAL: No fevers, chills, night sweats, or weight loss.  EYES: No visual changes or eye pain ENT: No hearing changes.  No  history of nose bleeds.   RESPIRATORY: No cough, wheezing and shortness of breath.   CARDIOVASCULAR: Negative for chest pain, and palpitations.   GI: Negative for abdominal discomfort, blood in stools or black stools.  No recent change in bowel habits.   GU:  No history of incontinence.   MUSCLOSKELETAL: +history of joint pain or swelling.  No myalgias.   SKIN: Negative for lesions, rash, and itching.   ENDOCRINE: Negative for cold or heat intolerance, polydipsia or goiter.   PSYCH:  + depression or anxiety symptoms.   NEURO: As Above.   Vital Signs:  BP 124/64 mmHg  Pulse 77  Ht 5' 5.5" (1.664 m)  Wt 163 lb 4 oz (74.05 kg)  BMI 26.74 kg/m2  SpO2 98%   Neurological Exam: MENTAL STATUS including orientation to time, place, person, recent and remote memory, attention span and concentration, language, and fund of knowledge is normal.  Speech is not dysarthric.  CRANIAL NERVES:  Face is symmetric.  MOTOR:  Motor strength is 5/5 in all extremities.  No pronator drift.  Tone is normal.    MSRs:  Reflexes are 2+/4 in the upper extremities and 3+/4 in the lower extremities with crossed adductors bilaterally.  Plantars are down going.  SENSORY:  Intact to vibration and light touch throughout.  COORDINATION/GAIT:  Gait appears wide-based, spastic quality, but somewhat nonphysiological at times.   Data: Record from [facility], [facility] (Dr. Earlene Plater) [place], [place] [facility] 07/09/2013:  technically difficulty study with essentially normal results. 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  Needle EMG with left S1-S2 active radiculopathy and probable right S1-S2 radiculopathy.  Lab Results  Component Value Date   TSH 0.68 01/15/2014   Lab Results  Component Value Date   HGBA1C 8.0* 01/15/2014   Lab Results  Component Value Date   VITAMINB12 856 02/07/2014      IMPRESSION/PLAN: 1. 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  Small fiber diabetic neuropathy - Diabetes is poorly controlled, HbA1c 8.0  - EMG performed in [place] did not show neuropathy, she is not interested in repeating it at this time  - Did not tolerate higher dose of gabapentin  - Start trial of nortriptyline  3. Chronic back pain, most likely musculoskeletal - MRI lumbar spine from 2014 does not show any abnormalities  3. Tobacco use   PLAN/RECOMMENDATIONS:  1. Start nortriptyline  at bedtime 2.  Stop gabapentin  3. MRI thoracic spine wo contrast 4.  Strongly encouraged to stop smoking 5. Encouraged tight glycemic control 6. Return to clinic 13-month   The duration  of this appointment visit was 30 minutes of face-to-face time with the patient.  Greater than 50% of this time was spent in counseling, explanation of diagnosis, planning of further management, and coordination of care.   Thank you for allowing me to participate in patient's care.  If I can answer any additional questions, I would be pleased to do so.    Sincerely,    Ashly Goethe K. Allena Katz, DO

## 2014-06-18 LAB — CREATININE, SERUM: Creat: 0.74 mg/dL (ref 0.50–1.10)

## 2014-06-18 LAB — BUN: BUN: 11 mg/dL (ref 6–23)

## 2014-06-26 ENCOUNTER — Other Ambulatory Visit: Payer: Self-pay

## 2014-07-01 ENCOUNTER — Ambulatory Visit
Admission: RE | Admit: 2014-07-01 | Discharge: 2014-07-01 | Disposition: A | Discharge status: Home / Self Care | Payer: Medicare Other | Source: Ambulatory Visit | Attending: Neurology | Admitting: Neurology

## 2014-07-01 DIAGNOSIS — M79606 Pain in leg, unspecified: Secondary | ICD-10-CM

## 2014-07-01 DIAGNOSIS — R29898 Other symptoms and signs involving the musculoskeletal system: Secondary | ICD-10-CM

## 2014-07-01 DIAGNOSIS — M62838 Other muscle spasm: Secondary | ICD-10-CM

## 2014-07-01 DIAGNOSIS — R292 Abnormal reflex: Secondary | ICD-10-CM

## 2014-07-01 MED ORDER — GADOBENATE DIMEGLUMINE 529 MG/ML IV SOLN
15.0000 mL | Freq: Once | INTRAVENOUS | Status: AC | PRN
  Administered 2014-07-01: 15 mL via INTRAVENOUS

## 2014-07-26 ENCOUNTER — Ambulatory Visit: Payer: Self-pay | Admitting: Internal Medicine

## 2014-07-29 ENCOUNTER — Telehealth: Payer: Self-pay | Admitting: Nurse Practitioner

## 2014-07-29 NOTE — Telephone Encounter (Addendum)
Levothyroxine 125Mcg Tablet: last filled-12.30.15 Naproxen 500mg  Tablet : last filled 12.3.15

## 2014-07-29 NOTE — Telephone Encounter (Signed)
She needs OV. I thought I received staff note that she was switching to an ofc closer to her home. Don't see that she has followed through

## 2014-07-30 ENCOUNTER — Other Ambulatory Visit: Payer: Self-pay | Admitting: Nurse Practitioner

## 2014-07-30 DIAGNOSIS — M2550 Pain in unspecified joint: Secondary | ICD-10-CM

## 2014-07-30 DIAGNOSIS — E039 Hypothyroidism, unspecified: Secondary | ICD-10-CM

## 2014-07-30 MED ORDER — LEVOTHYROXINE SODIUM 125 MCG PO TABS: 125.0000 ug | ORAL_TABLET | Freq: Every day | ORAL | Status: DC

## 2014-07-30 MED ORDER — NAPROXEN 500 MG PO TABS: 500.0000 mg | ORAL_TABLET | Freq: Every day | ORAL | Status: DC

## 2014-07-30 NOTE — Telephone Encounter (Signed)
Patient has an appt to establish care with Dr. Dorise HissKollar on 08/06/2014.

## 2014-08-06 ENCOUNTER — Ambulatory Visit (INDEPENDENT_AMBULATORY_CARE_PROVIDER_SITE_OTHER): Payer: Medicare Other | Admitting: Internal Medicine

## 2014-08-06 ENCOUNTER — Encounter: Payer: Self-pay | Admitting: Internal Medicine

## 2014-08-06 ENCOUNTER — Other Ambulatory Visit (INDEPENDENT_AMBULATORY_CARE_PROVIDER_SITE_OTHER): Payer: Medicare Other

## 2014-08-06 VITALS — BP 132/78 | HR 70 | Temp 97.9°F | Ht 65.45 in | Wt 160.0 lb

## 2014-08-06 DIAGNOSIS — E119 Type 2 diabetes mellitus without complications: Secondary | ICD-10-CM

## 2014-08-06 DIAGNOSIS — E538 Deficiency of other specified B group vitamins: Secondary | ICD-10-CM | POA: Diagnosis not present

## 2014-08-06 DIAGNOSIS — E039 Hypothyroidism, unspecified: Secondary | ICD-10-CM

## 2014-08-06 DIAGNOSIS — G629 Polyneuropathy, unspecified: Secondary | ICD-10-CM

## 2014-08-06 DIAGNOSIS — Z23 Encounter for immunization: Secondary | ICD-10-CM

## 2014-08-06 LAB — COMPREHENSIVE METABOLIC PANEL
ALBUMIN: 4.7 g/dL (ref 3.5–5.2)
ALT: 91 U/L — ABNORMAL HIGH (ref 0–35)
AST: 54 U/L — AB (ref 0–37)
Alkaline Phosphatase: 89 U/L (ref 39–117)
BUN: 12 mg/dL (ref 6–23)
CALCIUM: 9.8 mg/dL (ref 8.4–10.5)
CO2: 27 mEq/L (ref 19–32)
Chloride: 104 mEq/L (ref 96–112)
Creatinine, Ser: 0.82 mg/dL (ref 0.40–1.20)
GFR: 78.03 mL/min (ref >60.00)
GLUCOSE: 62 mg/dL — AB (ref 70–99)
Potassium: 3.5 mEq/L (ref 3.5–5.1)
SODIUM: 138 meq/L (ref 135–145)
Total Bilirubin: 0.5 mg/dL (ref 0.2–1.2)
Total Protein: 7.7 g/dL (ref 6.0–8.3)

## 2014-08-06 LAB — LIPID PANEL
CHOL/HDL RATIO: 5
CHOLESTEROL: 269 mg/dL — AB (ref 0–200)
HDL: 57.8 mg/dL (ref >39.00)
LDL Cholesterol: 174 mg/dL — ABNORMAL HIGH (ref 0–99)
NONHDL: 211.2
TRIGLYCERIDES: 186 mg/dL — AB (ref 0.0–149.0)
VLDL: 37.2 mg/dL (ref 0.0–40.0)

## 2014-08-06 LAB — HEMOGLOBIN A1C: HEMOGLOBIN A1C: 7.3 % — AB (ref 4.6–6.5)

## 2014-08-06 LAB — VITAMIN B12: Vitamin B-12: 614 pg/mL (ref 211–911)

## 2014-08-06 LAB — TSH: TSH: 0.89 u[IU]/mL (ref 0.35–4.50)

## 2014-08-06 MED ORDER — LEVOTHYROXINE SODIUM 125 MCG PO TABS: 125.0000 ug | ORAL_TABLET | Freq: Every day | ORAL | Status: DC

## 2014-08-06 MED ORDER — ATORVASTATIN CALCIUM 40 MG PO TABS: 40.0000 mg | ORAL_TABLET | Freq: Every day | ORAL | Status: DC

## 2014-08-06 MED ORDER — HYDROCODONE-ACETAMINOPHEN 5-325 MG PO TABS: 1.0000 | ORAL_TABLET | Freq: Every evening | ORAL | Status: DC | PRN

## 2014-08-06 MED ORDER — VARENICLINE TARTRATE 0.5 MG X 11 & 1 MG X 42 PO MISC: ORAL | Status: DC

## 2014-08-06 MED ORDER — METFORMIN HCL 1000 MG PO TABS: 1000.0000 mg | ORAL_TABLET | Freq: Two times a day (BID) | ORAL | Status: DC

## 2014-08-06 MED ORDER — LISINOPRIL 5 MG PO TABS: 5.0000 mg | ORAL_TABLET | Freq: Every day | ORAL | Status: DC

## 2014-08-06 NOTE — Patient Instructions (Signed)
We are going to check your blood work today, we will call you back with the results.   We will send a message to the neurologist to see if we can adjust the dosing on the nortriptyline before you see her back.  I have given you the chantix to stop smoking. We recommend that you stop smoking in the first month. We recommend that you take the chantix for 3 months for best success with stopping smoking.   We have sent in your refills.

## 2014-08-06 NOTE — Progress Notes (Signed)
Pre visit review using our clinic review tool, if applicable. No additional management support is needed unless otherwise documented below in the visit note. 

## 2014-08-08 NOTE — Progress Notes (Signed)
   Subjective:    Patient ID: Shelia Leon, female    DOB: 03/10/1961, 54 y.o.   MRN: [id number]  HPI The patient is a 54 YO female who is here to follow up on her vitamin B12 deficiency. She used to be on injections for years but was recently switched to pills only. She is not sure she has the energy she used to have. She also has been having some more numbness in her feet and hands. She has had this for years as well and it is mildly worsening over the years. No change in her diet. No fever, chills, weight loss.   Review of Systems  Constitutional: Positive for fatigue. Negative for fever, chills, activity change, appetite change and unexpected weight change.  HENT: Negative.   Respiratory: Negative for cough, chest tightness, shortness of breath and wheezing.   Cardiovascular: Negative for chest pain, palpitations and leg swelling.  Gastrointestinal: Negative for nausea, abdominal pain, diarrhea, constipation and abdominal distention.  Endocrine: Negative.   Musculoskeletal: Negative.   Skin: Negative.   Neurological: Positive for numbness. Negative for dizziness, weakness and headaches.  Psychiatric/Behavioral: Negative.       Objective:   Physical Exam  Constitutional: She is oriented to person, place, and time. She appears well-developed and well-nourished.  HENT:  Head: Normocephalic and atraumatic.  Eyes: EOM are normal.  Neck: Normal range of motion.  Cardiovascular: Normal rate and regular rhythm.   Pulmonary/Chest: Effort normal and breath sounds normal.  Abdominal: Soft.  Musculoskeletal:  Mild decreased sensation in the stocking glove distribution.   Neurological: She is alert and oriented to person, place, and time. Coordination normal.  Skin: Skin is warm and dry.  Psychiatric: She has a normal mood and affect.   Filed Vitals:   08/06/14 1321  BP: 132/78  Pulse: 70  Temp: 97.9 F (36.6 C)  TempSrc: Oral  Height: 5' 5.45" (1.662 m)  Weight: 160 lb (72.576 kg)    SpO2: 98%      Assessment & Plan:  Flu and pneumonia shot given.

## 2014-08-08 NOTE — Assessment & Plan Note (Signed)
Check B12 level and folate. Check HgA1c, HIV, TSH to check for other causes of the change in energy level. Reassured her that there is good evidence that her B12 supplement oral is as good as the injections she was taking in the past.

## 2014-09-09 ENCOUNTER — Telehealth: Payer: Self-pay | Admitting: Internal Medicine

## 2014-09-09 ENCOUNTER — Other Ambulatory Visit: Payer: Self-pay | Admitting: Geriatric Medicine

## 2014-09-09 DIAGNOSIS — G629 Polyneuropathy, unspecified: Secondary | ICD-10-CM

## 2014-09-09 MED ORDER — HYDROCODONE-ACETAMINOPHEN 5-325 MG PO TABS: 1.0000 | ORAL_TABLET | Freq: Every evening | ORAL | Status: DC | PRN

## 2014-09-09 NOTE — Telephone Encounter (Signed)
Patient needs refill for HYDROcodone-acetaminophen (NORCO/VICODIN) 5-325 MG per tablet [[id number]][[id number]]

## 2014-09-09 NOTE — Telephone Encounter (Signed)
Printed and waiting signature.

## 2014-09-09 NOTE — Telephone Encounter (Signed)
Patient will come pick up today.

## 2014-09-11 ENCOUNTER — Other Ambulatory Visit: Payer: Self-pay | Admitting: Geriatric Medicine

## 2014-09-11 DIAGNOSIS — E119 Type 2 diabetes mellitus without complications: Secondary | ICD-10-CM

## 2014-09-11 MED ORDER — METFORMIN HCL 1000 MG PO TABS: 1000.0000 mg | ORAL_TABLET | Freq: Two times a day (BID) | ORAL | Status: DC

## 2014-10-01 ENCOUNTER — Other Ambulatory Visit: Payer: Self-pay | Admitting: Geriatric Medicine

## 2014-10-01 DIAGNOSIS — G629 Polyneuropathy, unspecified: Secondary | ICD-10-CM

## 2014-10-01 DIAGNOSIS — E039 Hypothyroidism, unspecified: Secondary | ICD-10-CM

## 2014-10-01 DIAGNOSIS — E119 Type 2 diabetes mellitus without complications: Secondary | ICD-10-CM

## 2014-10-01 MED ORDER — ATORVASTATIN CALCIUM 40 MG PO TABS: 40.0000 mg | ORAL_TABLET | Freq: Every day | ORAL | Status: DC

## 2014-10-01 MED ORDER — LEVOTHYROXINE SODIUM 125 MCG PO TABS: 125.0000 ug | ORAL_TABLET | Freq: Every day | ORAL | Status: DC

## 2014-10-01 MED ORDER — TIZANIDINE HCL 2 MG PO TABS: ORAL_TABLET | ORAL | Status: DC

## 2014-10-01 MED ORDER — METFORMIN HCL 1000 MG PO TABS: 1000.0000 mg | ORAL_TABLET | Freq: Two times a day (BID) | ORAL | Status: DC

## 2014-10-01 MED ORDER — LISINOPRIL 5 MG PO TABS: 5.0000 mg | ORAL_TABLET | Freq: Every day | ORAL | Status: DC

## 2014-10-03 ENCOUNTER — Other Ambulatory Visit: Payer: Self-pay

## 2014-10-03 DIAGNOSIS — G629 Polyneuropathy, unspecified: Secondary | ICD-10-CM

## 2014-10-03 MED ORDER — TIZANIDINE HCL 2 MG PO TABS: 2.0000 mg | ORAL_TABLET | Freq: Three times a day (TID) | ORAL | Status: DC | PRN

## 2014-10-07 ENCOUNTER — Telehealth: Payer: Self-pay | Admitting: Internal Medicine

## 2014-10-07 DIAGNOSIS — G629 Polyneuropathy, unspecified: Secondary | ICD-10-CM

## 2014-10-07 NOTE — Telephone Encounter (Signed)
Last filled on 09/09/14 and patient's next ov is in May. Please advise, thanks.

## 2014-10-07 NOTE — Telephone Encounter (Signed)
Patient is requesting refill script of hydrocodone.

## 2014-10-08 MED ORDER — HYDROCODONE-ACETAMINOPHEN 5-325 MG PO TABS: 1.0000 | ORAL_TABLET | Freq: Every evening | ORAL | Status: DC | PRN

## 2014-10-08 NOTE — Telephone Encounter (Signed)
Left message on both home and cell numbers informing patient rx is ready for pick up.

## 2014-10-08 NOTE — Telephone Encounter (Signed)
Printed and signed for pickup.  

## 2014-10-15 ENCOUNTER — Encounter: Payer: Self-pay | Admitting: Neurology

## 2014-10-15 ENCOUNTER — Ambulatory Visit (INDEPENDENT_AMBULATORY_CARE_PROVIDER_SITE_OTHER): Payer: Medicare Other | Admitting: Neurology

## 2014-10-15 VITALS — BP 100/70 | HR 82 | Ht 65.0 in | Wt 157.1 lb

## 2014-10-15 DIAGNOSIS — M62838 Other muscle spasm: Secondary | ICD-10-CM

## 2014-10-15 DIAGNOSIS — M6249 Contracture of muscle, multiple sites: Secondary | ICD-10-CM

## 2014-10-15 DIAGNOSIS — Z720 Tobacco use: Secondary | ICD-10-CM

## 2014-10-15 DIAGNOSIS — R292 Abnormal reflex: Secondary | ICD-10-CM | POA: Diagnosis not present

## 2014-10-15 DIAGNOSIS — R269 Unspecified abnormalities of gait and mobility: Secondary | ICD-10-CM

## 2014-10-15 DIAGNOSIS — F17200 Nicotine dependence, unspecified, uncomplicated: Secondary | ICD-10-CM

## 2014-10-15 MED ORDER — NORTRIPTYLINE HCL 10 MG PO CAPS: ORAL_CAPSULE | ORAL | Status: DC

## 2014-10-15 NOTE — Patient Instructions (Signed)
1.   Increase nortriptyline as follows:  Week 1-2: 20mg  at bedtime (2 tablets)  Week 3-4: 30mg  at bedtime (3 tablets)  Week 5-6 40mg  at bedtime  (4 tablets)  Thereafter: 50mg  at bedtime (5 tablets)  If you are tolerating 50mg  dose, please call my office so I can send you a new prescriptions for 50mg  tablets.  2.  MRI brain and cervical spine wwo contrast - June 1st week  3.  Keep up the great work staying active!  4.  You're on the right path to stop smoking, keep it up.  5.  Return to clinic in 5 months

## 2014-10-15 NOTE — Progress Notes (Signed)
Follow-up Visit   Date: 10/15/2014    Shelia Leon MRN: [id number] DOB: 06-15-1960   Interim History: Kyisha Fowle is a 54 y.o. right-handed Caucasian female with diabetic neuropathy (HbA1c 8.0, diagnosed 2001), hyperlipidemia, hypothyroidism, cervical spondylosis s/p anterior decompression and fusion, current tobacco use, and vitamin B12 deficiency returning to the clinic for follow-up of bilateral leg pain and gait abnormality.  History of present illness: In 2011, she was hospitalized because of behavior changes described as not eating, not talking, combative, "unresponsive", but no loss of consciousness. She does not recall the details well, but reports she was "even eating flowers" She reports not getting out of the bed for 3 weeks and was eventually discharged to in-patient rehab and was discharged with a walker. After completing out-patient therapy, she used a cane which she has been using since then.   No similar spells since then. Around the same time, she developed developed gait unsteadiness and numbness of the feet with intermittent stabbing pain. She also has severe painful leg cramps. Denies any exacerbating or alleviating factors.  She is currently taking gabapentin  at bedtime and vocidin.  She moved from [place], [place] in June 2015 and was seeing Dr. Earlene Plater, neurologist. She had NCS/EMG which did not show evidence of large fiber neuropathy, although there was active changes in the S1 myotomes. Subsequent MRI lumbar spine did not show any abnormalities.   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  UPDATE 06/17/2013:  She tried increasing gabapentin to  twice daily but did not tolerate it at a higher dose, so it back down to taking it to one tablet at bedtime.  She continues to  have difficulty with walking, numbness, and spasms of her legs.  She has already seen pain management in [place] for her symptoms and does not want to see them again.    UPDATE 10/15/2014:  She has started water aerobics and is really enjoying it.  She is taking nortripyline  and has not noticed a huge different, but denies any side effects.  She is having new right hip, knee, and left thumb achy pain and has been icing it.  Her MRI thoracic spine was reviewed and did not show any abnormalities.    Medications:  Current Outpatient Prescriptions on File Prior to Visit  Medication Sig Dispense Refill  . Alcohol Swabs (ALCOHOL PREPS) PADS by Does not apply route.    Marland Kitchen atorvastatin (LIPITOR) 40 MG tablet Take 1 tablet (40 mg total) by mouth at bedtime. 90 tablet 3  . Cholecalciferol (VITAMIN D3) 2000 UNITS TABS Take 2,000 Units by mouth daily.    . clotrimazole (LOTRIMIN) 1 % cream Apply 1 application topically 2 (two) times daily.    . cyanocobalamin 1000 MCG tablet Take 100 mcg by mouth daily.    Marland Kitchen docusate sodium (COLACE) 100 MG capsule Take 100 mg by mouth 2 (two) times daily.    Marland Kitchen glucose blood test strip 1 each by Other route 3 (three) times daily as needed for other. Reagent Test Strips    . HYDROcodone-acetaminophen (NORCO/VICODIN) 5-325 MG per tablet Take 1 tablet by mouth at bedtime as needed for moderate pain. 30 tablet 0  . Lancets MISC by Does not apply route 3 (three) times daily as needed.    Marland Kitchen levothyroxine (LEVOTHROID) 125 MCG tablet  Take 1 tablet (125 mcg total) by mouth daily before breakfast. 90 tablet 3  . lisinopril (PRINIVIL,ZESTRIL) 5 MG tablet Take 1 tablet (5 mg total) by mouth daily. 90 tablet 3  . metFORMIN (GLUCOPHAGE) 1000 MG tablet Take 1 tablet (1,000 mg total) by mouth 2 (two) times daily with a meal. 180 tablet 3  . naproxen (NAPROSYN) 500 MG tablet Take 1 tablet (500 mg total) by mouth daily with lunch. 30 tablet 0  . tiZANidine (ZANAFLEX) 2 MG tablet Take 1-2  tablets (2-4 mg total) by mouth 3 (three) times daily as needed for muscle spasms. 180 tablet 1   No current facility-administered medications on file prior to visit.    Allergies:  Allergies  Allergen Reactions  . Penicillins     Review of Systems:  CONSTITUTIONAL: No fevers, chills, night sweats, or weight loss.  EYES: No visual changes or eye pain ENT: No hearing changes.  No history of nose bleeds.   RESPIRATORY: No cough, wheezing and shortness of breath.   CARDIOVASCULAR: Negative for chest pain, and palpitations.   GI: Negative for abdominal discomfort, blood in stools or black stools.  No recent change in bowel habits.   GU:  No history of incontinence.   MUSCLOSKELETAL: +history of joint pain or swelling.  No myalgias.   SKIN: Negative for lesions, rash, and itching.   ENDOCRINE: Negative for cold or heat intolerance, polydipsia or goiter.   PSYCH:  + depression or anxiety symptoms.   NEURO: As Above.   Vital Signs:  BP 100/70 mmHg  Pulse 82  Ht  (1.651 m)  Wt 157 lb 1 oz (71.243 kg)  BMI 26.14 kg/m2  SpO2 98%   Neurological Exam: MENTAL STATUS including orientation to time, place, person, recent and remote memory, attention span and concentration, language, and fund of knowledge is normal.  Speech is not dysarthric.  CRANIAL NERVES:  Face is symmetric.  MOTOR:  Motor strength is 5/5 in all extremities.  No pronator drift.  Tone is normal.    MSRs:  Reflexes are 2+/4 in the upper extremities and 3+/4 in the lower extremities with crossed adductors bilaterally.  Plantars are down going.  SENSORY:  Intact to vibration and light touch throughout.  COORDINATION/GAIT:  Gait appears wide-based, spastic quality, but somewhat nonphysiological at times.   Data: Record from [facility], [facility] (Dr. Earlene Plater) [place], [place] [facility] 07/09/2013:  technically difficulty study with essentially normal results. 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  Needle EMG with left S1-S2 active radiculopathy and probable right S1-S2 radiculopathy.  Lab Results  Component Value Date   TSH 0.89 08/06/2014   Lab Results  Component Value Date   HGBA1C 7.3* 08/06/2014   Lab Results  Component Value Date   VITAMINB12 614 08/06/2014   MRI thoracic spine wo contrast 07/01/2014:  Normal-appearing thoracic spine.  Status post cervical fusion as seen [facility].  IMPRESSION/PLAN: 1. 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  Small fiber diabetic neuropathy - Diabetes is poorly controlled, HbA1c 7.3  - EMG performed in [place] did not show neuropathy, she is not interested in repeating it at this time  - Did not tolerate higher dose of gabapentin  - Titrate nortriptyline  3.  Chronic back pain, most likely musculoskeletal - MRI lumbar spine from 2014 does not show any abnormalities  4. Tobacco use, trying to quit  5.  Arthralgias  PLAN/RECOMMENDATIONS:  1. Increase nortriptyline 20mg  at bedtime x 2 weeks, and increase by 10mg  each two weeks to goal of 50mg  daily 2.  MRI brain and cervical spine wwo contrast 3.  Praised for staying active and trying to cut back on smoking (down to 4 cigarettes daily) 4.  Return to clinic 591-month   The duration of this appointment visit was 25 minutes of face-to-face time with the patient.  Greater than 50% of this time was spent in counseling, explanation of diagnosis, planning of further management, and coordination of care.   Thank you for allowing me to participate in patient's care.  If I can answer any additional questions, I would be pleased to do so.    Sincerely,    Miche Loughridge K. Allena KatzPatel, DO

## 2014-10-17 ENCOUNTER — Telehealth: Payer: Self-pay | Admitting: Neurology

## 2014-10-17 NOTE — Telephone Encounter (Signed)
Patient has already called [facility] and her appointment is now on June 3.

## 2014-10-17 NOTE — Telephone Encounter (Signed)
Per Pattricia BossAnnie, pt's radiology precert is still pending. Pt has appt at [facility]. Pattricia Bossnnie is unsure precert will be available prior to appt. She recommends we reschedule appt. We will need to contact pt and [facility]. / Sherri S.

## 2014-11-04 ENCOUNTER — Other Ambulatory Visit (INDEPENDENT_AMBULATORY_CARE_PROVIDER_SITE_OTHER): Payer: Medicare Other

## 2014-11-04 ENCOUNTER — Ambulatory Visit (INDEPENDENT_AMBULATORY_CARE_PROVIDER_SITE_OTHER): Payer: Medicare Other | Admitting: Internal Medicine

## 2014-11-04 ENCOUNTER — Encounter: Payer: Self-pay | Admitting: Internal Medicine

## 2014-11-04 VITALS — BP 100/70 | HR 66 | Temp 98.4°F | Resp 14 | Ht 65.0 in | Wt 156.0 lb

## 2014-11-04 DIAGNOSIS — M159 Polyosteoarthritis, unspecified: Secondary | ICD-10-CM

## 2014-11-04 DIAGNOSIS — E119 Type 2 diabetes mellitus without complications: Secondary | ICD-10-CM | POA: Diagnosis not present

## 2014-11-04 DIAGNOSIS — M150 Primary generalized (osteo)arthritis: Secondary | ICD-10-CM

## 2014-11-04 DIAGNOSIS — G629 Polyneuropathy, unspecified: Secondary | ICD-10-CM

## 2014-11-04 LAB — BASIC METABOLIC PANEL
BUN: 8 mg/dL (ref 6–23)
CHLORIDE: 102 meq/L (ref 96–112)
CO2: 27 meq/L (ref 19–32)
CREATININE: 0.81 mg/dL (ref 0.40–1.20)
Calcium: 9.8 mg/dL (ref 8.4–10.5)
GFR: 79.06 mL/min (ref >60.00)
Glucose, Bld: 157 mg/dL — ABNORMAL HIGH (ref 70–99)
POTASSIUM: 4.5 meq/L (ref 3.5–5.1)
Sodium: 136 mEq/L (ref 135–145)

## 2014-11-04 LAB — HEMOGLOBIN A1C: Hgb A1c MFr Bld: 7.2 % — ABNORMAL HIGH (ref 4.6–6.5)

## 2014-11-04 MED ORDER — HYDROCODONE-ACETAMINOPHEN 5-325 MG PO TABS: 1.0000 | ORAL_TABLET | Freq: Every evening | ORAL | Status: DC | PRN

## 2014-11-04 NOTE — Assessment & Plan Note (Signed)
On ACE-I, checking HgA1c today. Had eye exam done recently without changes. Recent sugar 58 and likely from skipping meals and more exercise. Talked to her about meals and about how to eat before exercise to keep her sugars okay. If HgA1c significantly down may decrease dose of metformin.

## 2014-11-04 NOTE — Progress Notes (Signed)
   Subjective:    Patient ID: Shelia Leon, female    DOB: 02/11/1961, [age] y.o.   MRN: [id number]  HPI The patient is a 54 YO female who is coming in today for her diabetes. She has had 1 episode of low sugar since last visit which was 58. She did feel shaky and knew that her sugar was low. She then ate some peanut butter which brought it back to normal. She has been skipping some meals and exercising more to try to lose weight. She is doing 3-4 times per week exercise and has lost about 2-3 pounds since last visit. Has been taking her metformin consistently. Not much change in her diet but working on portions.   Review of Systems  Constitutional: Negative for fever, chills, activity change, appetite change and unexpected weight change.  Respiratory: Negative for cough, chest tightness, shortness of breath and wheezing.   Cardiovascular: Negative for chest pain, palpitations and leg swelling.  Gastrointestinal: Negative for nausea, abdominal pain, diarrhea, constipation and abdominal distention.  Musculoskeletal: Positive for arthralgias and gait problem.  Neurological: Positive for numbness. Negative for dizziness, weakness and headaches.      Objective:   Physical Exam  Constitutional: She is oriented to person, place, and time. She appears well-developed and well-nourished.  HENT:  Head: Normocephalic and atraumatic.  Eyes: EOM are normal.  Neck: Normal range of motion.  Cardiovascular: Normal rate and regular rhythm.   Pulmonary/Chest: Effort normal and breath sounds normal.  Abdominal: Soft.  Neurological: She is alert and oriented to person, place, and time. Coordination abnormal.  Uses cane for ambulation  Skin: Skin is warm and dry.   Filed Vitals:   11/04/14 0936  BP: 100/70  Pulse: 66  Temp: 98.4 F (36.9 C)  TempSrc: Oral  Resp: 14  Height: 5\' 5"  (1.651 m)  Weight: 156 lb (70.761 kg)  SpO2: 99%      Assessment & Plan:

## 2014-11-04 NOTE — Progress Notes (Signed)
Pre visit review using our clinic review tool, if applicable. No additional management support is needed unless otherwise documented below in the visit note. 

## 2014-11-04 NOTE — Assessment & Plan Note (Signed)
Using naproxen mostly and hydrocodone at night time for pain reduction so she can sleep. Refilled today. Due for urine screen in about 3 months.

## 2014-11-04 NOTE — Patient Instructions (Signed)
We will check on the blood work today for the diabetes and call you back and let you know if we need any changes.   We will get the mammogram ordered and the foot doctor. You should hear back about those within 2 weeks.   Diabetes and Exercise Exercising regularly is important. It is not just about losing weight. It has many health benefits, such as:  Improving your overall fitness, flexibility, and endurance.  Increasing your bone density.  Helping with weight control.  Decreasing your body fat.  Increasing your muscle strength.  Reducing stress and tension.  Improving your overall health. People with diabetes who exercise gain additional benefits because exercise:  Reduces appetite.  Improves the body's use of blood sugar (glucose).  Helps lower or control blood glucose.  Decreases blood pressure.  Helps control blood lipids (such as cholesterol and triglycerides).  Improves the body's use of the hormone insulin by:  Increasing the body's insulin sensitivity.  Reducing the body's insulin needs.  Decreases the risk for heart disease because exercising:  Lowers cholesterol and triglycerides levels.  Increases the levels of good cholesterol (such as high-density lipoproteins [HDL]) in the body.  Lowers blood glucose levels. YOUR ACTIVITY PLAN  Choose an activity that you enjoy and set realistic goals. Your health care provider or diabetes educator can help you make an activity plan that works for you. Exercise regularly as directed by your health care provider. This includes:  Performing resistance training twice a week such as push-ups, sit-ups, lifting weights, or using resistance bands.  Performing 150 minutes of cardio exercises each week such as walking, running, or playing sports.  Staying active and spending no more than 90 minutes at one time being inactive. Even short bursts of exercise are good for you. Three 10-minute sessions spread throughout the day  are just as beneficial as a single 30-minute session. Some exercise ideas include:  Taking the dog for a walk.  Taking the stairs instead of the elevator.  Dancing to your favorite song.  Doing an exercise video.  Doing your favorite exercise with a friend. RECOMMENDATIONS FOR EXERCISING WITH TYPE 1 OR TYPE 2 DIABETES   Check your blood glucose before exercising. If blood glucose levels are greater than 240 mg/dL, check for urine ketones. Do not exercise if ketones are present.  Avoid injecting insulin into areas of the body that are going to be exercised. For example, avoid injecting insulin into:  The arms when playing tennis.  The legs when jogging.  Keep a record of:  Food intake before and after you exercise.  Expected peak times of insulin action.  Blood glucose levels before and after you exercise.  The type and amount of exercise you have done.  Review your records with your health care provider. Your health care provider will help you to develop guidelines for adjusting food intake and insulin amounts before and after exercising.  If you take insulin or oral hypoglycemic agents, watch for signs and symptoms of hypoglycemia. They include:  Dizziness.  Shaking.  Sweating.  Chills.  Confusion.  Drink plenty of water while you exercise to prevent dehydration or heat stroke. Body water is lost during exercise and must be replaced.  Talk to your health care provider before starting an exercise program to make sure it is safe for you. Remember, almost any type of activity is better than none. Document Released: 08/21/2003 Document Revised: 10/15/2013 Document Reviewed: 11/07/2012 [facility] Patient Information 2015 [place], [place]. This information is  not intended to replace advice given to you by your health care provider. Make sure you discuss any questions you have with your health care provider.

## 2014-11-18 ENCOUNTER — Telehealth: Payer: Self-pay | Admitting: Neurology

## 2014-11-18 NOTE — Telephone Encounter (Signed)
[facility]  MRI brain wwo contrast 11/15/2014:  No acute intracranial abnormality.  Mild ethmoid sinus disease.  MRI cervical spine wwo contrast 11/15/2014:  C3-C7 fusion, no complications.  No significant central canal stenosis.  Mild left foraminal stenosis at C7-T1.  Moderate left foraminal stenosis at C3-C4 at the level of fusion.  Janaki Exley K. Allena KatzPatel, DO

## 2014-11-19 NOTE — Telephone Encounter (Signed)
Patient notified

## 2014-12-02 ENCOUNTER — Telehealth: Payer: Self-pay | Admitting: Internal Medicine

## 2014-12-02 ENCOUNTER — Ambulatory Visit: Payer: Self-pay | Admitting: Podiatry

## 2014-12-02 DIAGNOSIS — G629 Polyneuropathy, unspecified: Secondary | ICD-10-CM

## 2014-12-02 MED ORDER — HYDROCODONE-ACETAMINOPHEN 5-325 MG PO TABS: 1.0000 | ORAL_TABLET | Freq: Every evening | ORAL | Status: DC | PRN

## 2014-12-02 NOTE — Telephone Encounter (Signed)
She can pick up and please UDS.

## 2014-12-02 NOTE — Telephone Encounter (Signed)
Pt called in and needs a refill on her HYDROcodone-acetaminophen (NORCO/VICODIN) 5-325 MG per tablet [[id number]]

## 2014-12-04 ENCOUNTER — Ambulatory Visit (INDEPENDENT_AMBULATORY_CARE_PROVIDER_SITE_OTHER): Payer: Medicare Other | Admitting: Podiatry

## 2014-12-04 ENCOUNTER — Telehealth: Payer: Self-pay | Admitting: *Deleted

## 2014-12-04 ENCOUNTER — Ambulatory Visit (INDEPENDENT_AMBULATORY_CARE_PROVIDER_SITE_OTHER): Payer: Medicare Other

## 2014-12-04 DIAGNOSIS — M79671 Pain in right foot: Secondary | ICD-10-CM

## 2014-12-04 DIAGNOSIS — M2010 Hallux valgus (acquired), unspecified foot: Secondary | ICD-10-CM

## 2014-12-04 DIAGNOSIS — L03031 Cellulitis of right toe: Secondary | ICD-10-CM | POA: Diagnosis not present

## 2014-12-04 DIAGNOSIS — L03011 Cellulitis of right finger: Secondary | ICD-10-CM

## 2014-12-04 DIAGNOSIS — M2040 Other hammer toe(s) (acquired), unspecified foot: Secondary | ICD-10-CM

## 2014-12-04 DIAGNOSIS — M79672 Pain in left foot: Secondary | ICD-10-CM

## 2014-12-04 NOTE — Telephone Encounter (Signed)
Pt states she has some questions concerning her toenail procedure.  Pt states the toe is currently throbbing, I encouraged pt to make a knick in the dressing to relieve the pressure, or may even begin the soaks if still tender, also my use OTC Ibuprofen as directed for discomfort if she is able to tolerate.  Pt asked if she could work, I told pt to take OTC Ibuprofen if tolerated and wear looser shoes and elevate the foot if possible periodically.  Pt agreed.

## 2014-12-04 NOTE — Progress Notes (Signed)
Subjective:     Patient ID: Shelia Leon, female   DOB: 09-25-1960, 54 y.o.   MRN: [id number]  HPI patient presents with pain in the third toe of the right foot and also history of structural bunion deformity which was corrected approximately 30 years ago and also having a history of hammertoe deformity. 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      Objective:   Physical Exam  Constitutional: She is oriented to person, place, and time.  Cardiovascular: Intact distal pulses.   Musculoskeletal: Normal range of motion.  Neurological: She is oriented to person, place, and time.  Skin: Skin is warm.  Nursing note and vitals reviewed.  neurovascular status is intact muscle strength is adequate range of motion within normal limits. Patient's noted to have diminishment sharp Dole vibratory but only to a moderate level and is noted to have digital deformities. I did feel palpable pulses but they may be week and she is due to have testing done in the next couple weeks. The third toe right lateral borders incurvated and sore when pressed with a small amount of localized distal redness and irritated tissue     Assessment:     Ingrown toenail third toe right localized in nature lateral side along with digital deformities and possibility for circulatory diminishment    Plan:     H&P and x-rays discussed with patient. I do not recommend anything invasive currently until a better understanding of her underlying condition is still it. At this time I did go ahead and I carefully infiltrated the right third toe 60 Milligan times like Marcaine mixture remove the lateral border removed some proud flesh and applied sterile dressing to keep pressure off this corner. Reappoint after all testing is complete and we can decide if anything else will be necessary  for this patient

## 2014-12-04 NOTE — Progress Notes (Signed)
   Subjective:    Patient ID: Shelia Leon, female    DOB: March 10, 1961, 54 y.o.   MRN: [id number]  HPI  Pt presents with b/l hammertoe deformities, pain in toes right over left and states that her 3rd right toe is ingrown and painful. She has a h/o bunion surgery on the right foot, but denies pain in the surgical area  Review of Systems  Musculoskeletal: Positive for back pain, arthralgias and gait problem.  All other systems reviewed and are negative.      Objective:   Physical Exam        Assessment & Plan:

## 2014-12-04 NOTE — Patient Instructions (Signed)

## 2015-01-01 ENCOUNTER — Telehealth: Payer: Self-pay | Admitting: Internal Medicine

## 2015-01-01 DIAGNOSIS — G629 Polyneuropathy, unspecified: Secondary | ICD-10-CM

## 2015-01-01 MED ORDER — TIZANIDINE HCL 2 MG PO TABS: 2.0000 mg | ORAL_TABLET | Freq: Three times a day (TID) | ORAL | Status: DC | PRN

## 2015-01-01 MED ORDER — HYDROCODONE-ACETAMINOPHEN 5-325 MG PO TABS: 1.0000 | ORAL_TABLET | Freq: Every evening | ORAL | Status: DC | PRN

## 2015-01-01 NOTE — Telephone Encounter (Signed)
Patient need a refill of Hydrocodone, Tizanidine 2 mg, call her when sent out.

## 2015-01-01 NOTE — Telephone Encounter (Signed)
Informed patient that she can come and pick up prescription

## 2015-01-01 NOTE — Telephone Encounter (Signed)
Would you please refill? Appointments are up to date. Last refill was 12/02/14 for the hydrocodone and 10/03/14 for the tizanidine.

## 2015-01-01 NOTE — Telephone Encounter (Signed)
done

## 2015-01-29 ENCOUNTER — Other Ambulatory Visit: Payer: Self-pay | Admitting: Internal Medicine

## 2015-01-30 ENCOUNTER — Telehealth: Payer: Self-pay | Admitting: *Deleted

## 2015-01-30 DIAGNOSIS — G629 Polyneuropathy, unspecified: Secondary | ICD-10-CM

## 2015-01-30 MED ORDER — HYDROCODONE-ACETAMINOPHEN 5-325 MG PO TABS: 1.0000 | ORAL_TABLET | Freq: Every evening | ORAL | Status: DC | PRN

## 2015-01-30 NOTE — Telephone Encounter (Signed)
Left msg on triage requesting refill on hydrocodone.../lmb 

## 2015-01-30 NOTE — Telephone Encounter (Signed)
Printed and signed, UDS on pickup.

## 2015-01-31 NOTE — Telephone Encounter (Signed)
Patient will be in later this morning to pick up

## 2015-02-04 ENCOUNTER — Ambulatory Visit (INDEPENDENT_AMBULATORY_CARE_PROVIDER_SITE_OTHER): Payer: Medicare Other | Admitting: Internal Medicine

## 2015-02-04 ENCOUNTER — Other Ambulatory Visit (INDEPENDENT_AMBULATORY_CARE_PROVIDER_SITE_OTHER): Payer: Medicare Other

## 2015-02-04 ENCOUNTER — Other Ambulatory Visit: Payer: Self-pay

## 2015-02-04 ENCOUNTER — Encounter: Payer: Self-pay | Admitting: Internal Medicine

## 2015-02-04 VITALS — BP 122/82 | HR 70 | Temp 98.5°F | Resp 16 | Ht 65.5 in | Wt 153.1 lb

## 2015-02-04 DIAGNOSIS — E039 Hypothyroidism, unspecified: Secondary | ICD-10-CM | POA: Diagnosis not present

## 2015-02-04 DIAGNOSIS — M159 Polyosteoarthritis, unspecified: Secondary | ICD-10-CM

## 2015-02-04 DIAGNOSIS — E538 Deficiency of other specified B group vitamins: Secondary | ICD-10-CM

## 2015-02-04 DIAGNOSIS — M150 Primary generalized (osteo)arthritis: Secondary | ICD-10-CM | POA: Diagnosis not present

## 2015-02-04 LAB — VITAMIN B12: Vitamin B-12: 376 pg/mL (ref 211–911)

## 2015-02-04 LAB — TSH: TSH: 0.31 u[IU]/mL — AB (ref 0.35–4.50)

## 2015-02-04 MED ORDER — DICLOFENAC SODIUM 1 % TD GEL: 2.0000 g | Freq: Four times a day (QID) | TRANSDERMAL | Status: DC

## 2015-02-04 NOTE — Assessment & Plan Note (Signed)
We had stopped her injections about 6 months ago and she is just taking pills. Check B12 level today.

## 2015-02-04 NOTE — Patient Instructions (Signed)
We will send in a medicine cream called voltaren which you can rub on your knee when you have pain up to 4 times per day.   We will check some blood work today to check on the thyroid and the B12 levels.   Keep up the good work with exercise.   Diabetes and Exercise Exercising regularly is important. It is not just about losing weight. It has many health benefits, such as:  Improving your overall fitness, flexibility, and endurance.  Increasing your bone density.  Helping with weight control.  Decreasing your body fat.  Increasing your muscle strength.  Reducing stress and tension.  Improving your overall health. People with diabetes who exercise gain additional benefits because exercise:  Reduces appetite.  Improves the body's use of blood sugar (glucose).  Helps lower or control blood glucose.  Decreases blood pressure.  Helps control blood lipids (such as cholesterol and triglycerides).  Improves the body's use of the hormone insulin by:  Increasing the body's insulin sensitivity.  Reducing the body's insulin needs.  Decreases the risk for heart disease because exercising:  Lowers cholesterol and triglycerides levels.  Increases the levels of good cholesterol (such as high-density lipoproteins [HDL]) in the body.  Lowers blood glucose levels. YOUR ACTIVITY PLAN  Choose an activity that you enjoy and set realistic goals. Your health care provider or diabetes educator can help you make an activity plan that works for you. Exercise regularly as directed by your health care provider. This includes:  Performing resistance training twice a week such as push-ups, sit-ups, lifting weights, or using resistance bands.  Performing 150 minutes of cardio exercises each week such as walking, running, or playing sports.  Staying active and spending no more than 90 minutes at one time being inactive. Even short bursts of exercise are good for you. Three 10-minute sessions  spread throughout the day are just as beneficial as a single 30-minute session. Some exercise ideas include:  Taking the dog for a walk.  Taking the stairs instead of the elevator.  Dancing to your favorite song.  Doing an exercise video.  Doing your favorite exercise with a friend. RECOMMENDATIONS FOR EXERCISING WITH TYPE 1 OR TYPE 2 DIABETES   Check your blood glucose before exercising. If blood glucose levels are greater than 240 mg/dL, check for urine ketones. Do not exercise if ketones are present.  Avoid injecting insulin into areas of the body that are going to be exercised. For example, avoid injecting insulin into:  The arms when playing tennis.  The legs when jogging.  Keep a record of:  Food intake before and after you exercise.  Expected peak times of insulin action.  Blood glucose levels before and after you exercise.  The type and amount of exercise you have done.  Review your records with your health care provider. Your health care provider will help you to develop guidelines for adjusting food intake and insulin amounts before and after exercising.  If you take insulin or oral hypoglycemic agents, watch for signs and symptoms of hypoglycemia. They include:  Dizziness.  Shaking.  Sweating.  Chills.  Confusion.  Drink plenty of water while you exercise to prevent dehydration or heat stroke. Body water is lost during exercise and must be replaced.  Talk to your health care provider before starting an exercise program to make sure it is safe for you. Remember, almost any type of activity is better than none. Document Released: 08/21/2003 Document Revised: 10/15/2013 Document Reviewed: 11/07/2012 [employer]  Patient Information 2015 [employer]. This information is not intended to replace advice given to you by your health care provider. Make sure you discuss any questions you have with your health care provider.  

## 2015-02-04 NOTE — Progress Notes (Signed)
Pre visit review using our clinic review tool, if applicable. No additional management support is needed unless otherwise documented below in the visit note. 

## 2015-02-04 NOTE — Assessment & Plan Note (Signed)
Will try voltaren gel. Has tried and failed naproxen, tylenol, ibuprofen for the pain. She has had injection in the past which was helpful but she does not want to repeat. If this is not helpful likely needs referral to orthopedics.

## 2015-02-04 NOTE — Progress Notes (Signed)
   Subjective:    Patient ID: Shelia Leon, female    DOB: [birth year], 54 y.o.   MRN: [id number]  HPI The patient is a 54 YO female who is coming in for follow up of her B12 deficiency (stopped shots and continued on only pills about 6 months ago, uncomplicated), her thyroid (dosing stable of her levothyroxine, no complications, well controlled) and also worsening of her right knee osteoarthritis (previously had steroid injection and does not want repeat although it worked, taking naproxen and hydrocodone for pain, limits activity). She is struggling a lot with the knee pain. It causes her to occasionally take the hydrocodone more than she is prescribed.   Review of Systems  Constitutional: Negative for fever, chills, activity change, appetite change and unexpected weight change.  Respiratory: Negative for cough, chest tightness, shortness of breath and wheezing.   Cardiovascular: Negative for chest pain, palpitations and leg swelling.  Gastrointestinal: Negative for nausea, abdominal pain, diarrhea, constipation and abdominal distention.  Musculoskeletal: Positive for arthralgias and gait problem.  Neurological: Positive for numbness. Negative for dizziness, weakness and headaches.      Objective:   Physical Exam  Constitutional: She is oriented to person, place, and time. She appears well-developed and well-nourished.  HENT:  Head: Normocephalic and atraumatic.  Eyes: EOM are normal.  Neck: Normal range of motion.  Cardiovascular: Normal rate and regular rhythm.   Pulmonary/Chest: Effort normal and breath sounds normal.  Abdominal: Soft.  Neurological: She is alert and oriented to person, place, and time. Coordination abnormal.  Uses cane for ambulation  Skin: Skin is warm and dry.   Filed Vitals:   02/04/15 0927  BP: 122/82  Pulse: 70  Temp: 98.5 F (36.9 C)  TempSrc: Oral  Resp: 16  Height: 5' 5.5" (1.664 m)  Weight: 153 lb 1.9 oz (69.455 kg)  SpO2: 97%      Assessment &  Plan:

## 2015-02-04 NOTE — Assessment & Plan Note (Signed)
Checking TSH on levothyroxine 125 mcg daily. Has not had dose change in some time. No symptoms of over or under replacement.

## 2015-02-07 ENCOUNTER — Telehealth: Payer: Self-pay | Admitting: Internal Medicine

## 2015-02-07 NOTE — Telephone Encounter (Signed)
Patient states she needs refills on her medications to last her until her follow up appt in December. Metformin levothyroxine lisinopril lipitor naproxen tizanidine Voltaren gel is too expensive and replace with patches (they are covered). [facility]

## 2015-02-10 ENCOUNTER — Other Ambulatory Visit: Payer: Self-pay | Admitting: Geriatric Medicine

## 2015-02-10 ENCOUNTER — Telehealth: Payer: Self-pay | Admitting: Neurology

## 2015-02-10 DIAGNOSIS — E119 Type 2 diabetes mellitus without complications: Secondary | ICD-10-CM

## 2015-02-10 DIAGNOSIS — M2550 Pain in unspecified joint: Secondary | ICD-10-CM

## 2015-02-10 DIAGNOSIS — E039 Hypothyroidism, unspecified: Secondary | ICD-10-CM

## 2015-02-10 MED ORDER — TIZANIDINE HCL 2 MG PO TABS: ORAL_TABLET | ORAL | Status: DC

## 2015-02-10 MED ORDER — NAPROXEN 500 MG PO TABS: 500.0000 mg | ORAL_TABLET | Freq: Every day | ORAL | Status: DC

## 2015-02-10 MED ORDER — LISINOPRIL 5 MG PO TABS: 5.0000 mg | ORAL_TABLET | Freq: Every day | ORAL | Status: DC

## 2015-02-10 MED ORDER — LEVOTHYROXINE SODIUM 125 MCG PO TABS: 125.0000 ug | ORAL_TABLET | Freq: Every day | ORAL | Status: DC

## 2015-02-10 MED ORDER — METFORMIN HCL 1000 MG PO TABS: 1000.0000 mg | ORAL_TABLET | Freq: Two times a day (BID) | ORAL | Status: DC

## 2015-02-10 MED ORDER — ATORVASTATIN CALCIUM 40 MG PO TABS: 40.0000 mg | ORAL_TABLET | Freq: Every day | ORAL | Status: DC

## 2015-02-10 MED ORDER — LIDOCAINE 5 % EX PTCH: 1.0000 | MEDICATED_PATCH | CUTANEOUS | Status: DC

## 2015-02-10 NOTE — Telephone Encounter (Signed)
Pt called and wanted to get the Nortriptyline in  and called in to [facility] CB# [phone]

## 2015-02-10 NOTE — Telephone Encounter (Signed)
Sent in lidocaine patches which she can use daily instead of voltaren.

## 2015-02-10 NOTE — Telephone Encounter (Signed)
I refilled everything but the Voltaren. Please look at that one.

## 2015-02-11 ENCOUNTER — Other Ambulatory Visit: Payer: Self-pay | Admitting: *Deleted

## 2015-02-11 MED ORDER — NORTRIPTYLINE HCL 10 MG PO CAPS: ORAL_CAPSULE | ORAL | Status: DC

## 2015-02-11 NOTE — Telephone Encounter (Signed)
Rx sent 

## 2015-02-28 ENCOUNTER — Telehealth: Payer: Self-pay | Admitting: *Deleted

## 2015-02-28 DIAGNOSIS — G629 Polyneuropathy, unspecified: Secondary | ICD-10-CM

## 2015-02-28 MED ORDER — HYDROCODONE-ACETAMINOPHEN 5-325 MG PO TABS: 1.0000 | ORAL_TABLET | Freq: Every evening | ORAL | Status: DC | PRN

## 2015-02-28 NOTE — Telephone Encounter (Signed)
Printed and signed.  

## 2015-02-28 NOTE — Telephone Encounter (Signed)
Receive call pt is requesting refill on her hydrocodone. Last filled 01/30/15 pls advise...[name]

## 2015-02-28 NOTE — Telephone Encounter (Signed)
Notified pt rx ready for pick-up.../lmb 

## 2015-03-05 ENCOUNTER — Ambulatory Visit (INDEPENDENT_AMBULATORY_CARE_PROVIDER_SITE_OTHER): Payer: Medicare Other | Admitting: Neurology

## 2015-03-05 ENCOUNTER — Encounter: Payer: Self-pay | Admitting: Neurology

## 2015-03-05 VITALS — BP 100/70 | HR 69 | Ht 65.0 in | Wt 153.1 lb

## 2015-03-05 DIAGNOSIS — M6249 Contracture of muscle, multiple sites: Secondary | ICD-10-CM | POA: Diagnosis not present

## 2015-03-05 DIAGNOSIS — Z720 Tobacco use: Secondary | ICD-10-CM

## 2015-03-05 DIAGNOSIS — E1149 Type 2 diabetes mellitus with other diabetic neurological complication: Secondary | ICD-10-CM | POA: Diagnosis not present

## 2015-03-05 DIAGNOSIS — M62838 Other muscle spasm: Secondary | ICD-10-CM

## 2015-03-05 DIAGNOSIS — F17200 Nicotine dependence, unspecified, uncomplicated: Secondary | ICD-10-CM

## 2015-03-05 DIAGNOSIS — R292 Abnormal reflex: Secondary | ICD-10-CM | POA: Diagnosis not present

## 2015-03-05 NOTE — Progress Notes (Signed)
Follow-up Visit   Date: 03/05/2015    Shelia Leon MRN: [id number] DOB: [birth year]   Interim History: Shelia Leon is a 54 y.o. right-handed Caucasian female with diabetic neuropathy (HbA1c 8.0, diagnosed 2001), hyperlipidemia, hypothyroidism, cervical spondylosis s/p anterior decompression and fusion, current tobacco use, and vitamin B12 deficiency returning to the clinic for follow-up of bilateral leg pain and gait abnormality.  History of present illness: In 2011, she was hospitalized because of behavior changes described as not eating, not talking, combative, "unresponsive", but no loss of consciousness. She does not recall the details well, but reports she was "even eating flowers" She reports not getting out of the bed for 3 weeks and was eventually discharged to in-patient rehab and was discharged with a walker. After completing out-patient therapy, she used a cane which she has been using since then.   No similar spells since then. Around the same time, she developed developed gait unsteadiness and numbness of the feet with intermittent stabbing pain. She also has severe painful leg cramps. Denies any exacerbating or alleviating factors.  She is currently taking gabapentin  at bedtime and vocidin.  She moved from [place], [place] in June 2015 and was seeing Dr. Earlene Plater, neurologist. She had NCS/EMG which did not show evidence of large fiber neuropathy, although there was active changes in the S1 myotomes. Subsequent MRI lumbar spine did not show any abnormalities.   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  UPDATE 06/17/2013:  She tried increasing gabapentin to  twice daily but did not tolerate it at a higher dose, so it back down to taking it to one tablet at bedtime.  She continues to  have difficulty with walking, numbness, and spasms of her legs.  She has already seen pain management in [place] for her symptoms and does not want to see them again.    UPDATE 10/15/2014:  She has started water aerobics and is really enjoying it.  She is taking nortripyline  and has not noticed a huge different, but denies any side effects.  She is having new right hip, knee, and left thumb achy pain and has been icing it.  Her MRI thoracic spine was reviewed and did not show any abnormalities.    UPDATE 03/05/2015:  No new complaints today, she is feeling well and pain is well controlled on nortriptyline  at bedtime.  When she tried increasing the dose, it made her nauseous.  She is cutting back on smoking which she is now down to 3 cigarretes per day.  She remains active.  She is also able to walk without her cane most of the time.     Medications:  Current Outpatient Prescriptions on File Prior to Visit  Medication Sig Dispense Refill  . Alcohol Swabs (ALCOHOL PREPS) PADS by Does not apply route.    Marland Kitchen atorvastatin (LIPITOR) 40 MG tablet Take 1 tablet (40 mg total) by mouth at bedtime. 90 tablet 3  . Cholecalciferol (VITAMIN D3) 2000 UNITS TABS Take 2,000 Units by mouth daily.    . clotrimazole (LOTRIMIN) 1 % cream Apply 1 application topically 2 (two) times daily.    . cyanocobalamin 1000 MCG tablet Take 100 mcg by mouth daily.    . diclofenac sodium (VOLTAREN) 1 % GEL Apply 2 g topically 4 (four) times daily. 100 g 3  .  docusate sodium (COLACE) 100 MG capsule Take 100 mg by mouth 2 (two) times daily.    Marland Kitchen glucose blood test strip 1 each by Other route 3 (three) times daily as needed for other. Reagent Test Strips    . HYDROcodone-acetaminophen (NORCO/VICODIN) 5-325 MG per tablet Take 1 tablet by mouth at bedtime as needed for moderate pain. 30 tablet 0  . Lancets MISC by Does not apply route 3 (three) times daily as needed.    Marland Kitchen levothyroxine (LEVOTHROID) 125 MCG tablet Take 1 tablet  (125 mcg total) by mouth daily before breakfast. 90 tablet 3  . lidocaine (LIDODERM) 5 % Place 1 patch onto the skin daily. Remove & Discard patch within 12 hours or as directed by MD 30 patch 0  . lisinopril (PRINIVIL,ZESTRIL) 5 MG tablet Take 1 tablet (5 mg total) by mouth daily. 90 tablet 3  . metFORMIN (GLUCOPHAGE) 1000 MG tablet Take 1 tablet (1,000 mg total) by mouth 2 (two) times daily with a meal. 180 tablet 3  . naproxen (NAPROSYN) 500 MG tablet Take 1 tablet (500 mg total) by mouth daily with lunch. 90 tablet 3  . nortriptyline (PAMELOR) 10 MG capsule Take 50 mg qhs (Patient taking differently: Take 30 mg qhs) 450 capsule 3  . tiZANidine (ZANAFLEX) 2 MG tablet Take 1 to 2 tablets by  mouth 3 times daily as  needed for muscle spasm(s) 180 tablet 3   No current facility-administered medications on file prior to visit.    Allergies:  Allergies  Allergen Reactions  . Penicillins     Review of Systems:  CONSTITUTIONAL: No fevers, chills, night sweats, or weight loss.  EYES: No visual changes or eye pain ENT: No hearing changes.  No history of nose bleeds.   RESPIRATORY: No cough, wheezing and shortness of breath.   CARDIOVASCULAR: Negative for chest pain, and palpitations.   GI: Negative for abdominal discomfort, blood in stools or black stools.  No recent change in bowel habits.   GU:  No history of incontinence.   MUSCLOSKELETAL: +history of joint pain or swelling.  No myalgias.   SKIN: Negative for lesions, rash, and itching.   ENDOCRINE: Negative for cold or heat intolerance, polydipsia or goiter.   PSYCH:  + depression or anxiety symptoms.   NEURO: As Above.   Vital Signs:  BP 100/70 mmHg  Pulse 69  Ht  (1.651 m)  Wt 153 lb 1 oz (69.429 kg)  BMI 25.47 kg/m2  SpO2 99%   Neurological Exam: MENTAL STATUS including orientation to time, place, person, recent and remote memory, attention span and concentration, language, and fund of knowledge is normal.  Speech is  not dysarthric.  CRANIAL NERVES:  Face is symmetric.  MOTOR:  Motor strength is 5/5 in all extremities.  No pronator drift.  Tone is increased in the legs.  Hammer toes noted bilaterally.   MSRs:  Reflexes are 2+/4 in the upper extremities and 3+/4 in the lower extremities with crossed adductors bilaterally.  Plantars are down going.  SENSORY:  Intact to vibration, pin prick and light touch throughout including the feet.  COORDINATION/GAIT:  Gait appears wide-based, spastic quality, but stable.  Data: Record from [facility] (Dr. Earlene Plater) [place], [place] SSEP 07/09/2013:  technically difficulty study with essentially normal results. 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  Needle EMG with left S1-S2 active radiculopathy and probable right S1-S2 radiculopathy.  Lab Results  Component Value Date   TSH 0.31* 02/04/2015   Lab Results  Component Value Date   HGBA1C 7.2* 11/04/2014   Lab Results  Component Value Date   VITAMINB12 376 02/04/2015   MRI thoracic spine wo contrast 07/01/2014:  Normal-appearing thoracic spine.  Status post cervical fusion as seen on [facility].  MRI brain wwo contrast 11/15/2014: No acute intracranial abnormality. Mild ethmoid sinus disease.  MRI cervical spine wwo contrast 11/15/2014: C3-C7 fusion, no complications. No significant central canal stenosis. Mild left foraminal stenosis at C7-T1. Moderate left foraminal stenosis at C3-C4 at the level of fusion.   IMPRESSION/PLAN: 1. 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  Small fiber diabetic neuropathy - Diabetes is poorly controlled, HbA1c 7.2  - EMG performed in [place] did not show neuropathy, she is not interested  in repeating it at this time  - Did not tolerate higher dose of gabapentin  - Titrate nortriptyline  3. Chronic back pain, most likely musculoskeletal - MRI lumbar spine from 2014 does not show any abnormalities  4. Tobacco use, trying to quit  5.  Arthralgias  PLAN/RECOMMENDATIONS:  1. Continue nortriptyline  at bedtime  2.  Continue tizanidine  TID as needed for muscle spasms 3.  Praised for staying active and trying to cut back on smoking (down to 3 cigarettes daily) 4.  Return to clinic as needed   The duration of this appointment visit was 20 minutes of face-to-face time with the patient.  Greater than 50% of this time was spent in counseling, explanation of diagnosis, planning of further management, and coordination of care.   Thank you for allowing me to participate in patient's care.  If I can answer any additional questions, I would be pleased to do so.    Sincerely,    Donika K. Allena Katz, DO

## 2015-03-05 NOTE — Patient Instructions (Addendum)
Keep up the good work! You are ok to undergo foot surgery from neurological standpoint. Try to do your best with maintaining your sugars under control as there can be poor wound healing in diabetics Continue nortriptyline  at bedtime and tizanidine  as needed for spasms You will quit smoking - set a date and stick to it Return to clinic as needed

## 2015-03-06 ENCOUNTER — Telehealth: Payer: Self-pay

## 2015-03-06 NOTE — Telephone Encounter (Signed)
PA started via cover my meds.  

## 2015-03-17 ENCOUNTER — Ambulatory Visit: Payer: Self-pay | Admitting: Neurology

## 2015-03-27 ENCOUNTER — Encounter: Payer: Self-pay | Admitting: Internal Medicine

## 2015-03-27 ENCOUNTER — Telehealth: Payer: Self-pay | Admitting: *Deleted

## 2015-03-27 DIAGNOSIS — G629 Polyneuropathy, unspecified: Secondary | ICD-10-CM

## 2015-03-27 MED ORDER — HYDROCODONE-ACETAMINOPHEN 5-325 MG PO TABS: 1.0000 | ORAL_TABLET | Freq: Every evening | ORAL | Status: DC | PRN

## 2015-03-27 NOTE — Telephone Encounter (Signed)
Called pt no answer LMOM rx ready for pick-up.../lmb 

## 2015-03-27 NOTE — Telephone Encounter (Signed)
Printed and signed, needs UDS for pickup.  

## 2015-03-27 NOTE — Telephone Encounter (Signed)
Left msg on triage requesting refill on her hydrocodone.../lmb 

## 2015-04-16 NOTE — Telephone Encounter (Signed)
PA needed to be resubmitted.

## 2015-04-21 NOTE — Telephone Encounter (Signed)
Received notice stating PA was previoulsy approved. Will refax to pharmacy sending to scan

## 2015-04-25 ENCOUNTER — Telehealth: Payer: Self-pay | Admitting: *Deleted

## 2015-04-25 DIAGNOSIS — G629 Polyneuropathy, unspecified: Secondary | ICD-10-CM

## 2015-04-25 NOTE — Telephone Encounter (Signed)
Left msg on triage requesting refill on her hydrocodone.../lmb 

## 2015-04-28 MED ORDER — HYDROCODONE-ACETAMINOPHEN 5-325 MG PO TABS: 1.0000 | ORAL_TABLET | Freq: Every evening | ORAL | Status: DC | PRN

## 2015-04-28 NOTE — Telephone Encounter (Signed)
Printed and signed.  

## 2015-04-28 NOTE — Telephone Encounter (Signed)
Notified pt rx ready for pick-up.../lmb 

## 2015-04-29 ENCOUNTER — Encounter: Payer: Self-pay | Admitting: Internal Medicine

## 2015-05-23 ENCOUNTER — Telehealth: Payer: Self-pay | Admitting: Internal Medicine

## 2015-05-23 DIAGNOSIS — G629 Polyneuropathy, unspecified: Secondary | ICD-10-CM

## 2015-05-23 NOTE — Telephone Encounter (Signed)
Pt request refill for HYDROcodone-acetaminophen (NORCO/VICODIN) 5-325 mg.

## 2015-05-26 MED ORDER — HYDROCODONE-ACETAMINOPHEN 5-325 MG PO TABS: 1.0000 | ORAL_TABLET | Freq: Every evening | ORAL | Status: DC | PRN

## 2015-05-26 NOTE — Telephone Encounter (Signed)
Called patient to let her know she can come pick up her prescription.

## 2015-05-26 NOTE — Telephone Encounter (Signed)
Printed and signed.  

## 2015-06-10 ENCOUNTER — Encounter: Payer: Self-pay | Admitting: Internal Medicine

## 2015-06-10 ENCOUNTER — Ambulatory Visit (INDEPENDENT_AMBULATORY_CARE_PROVIDER_SITE_OTHER): Payer: Medicare Other | Admitting: Internal Medicine

## 2015-06-10 ENCOUNTER — Other Ambulatory Visit (INDEPENDENT_AMBULATORY_CARE_PROVIDER_SITE_OTHER): Payer: Self-pay

## 2015-06-10 VITALS — BP 122/82 | HR 78 | Temp 98.2°F | Resp 14 | Ht 65.5 in | Wt 153.0 lb

## 2015-06-10 DIAGNOSIS — G629 Polyneuropathy, unspecified: Secondary | ICD-10-CM | POA: Diagnosis not present

## 2015-06-10 DIAGNOSIS — E039 Hypothyroidism, unspecified: Secondary | ICD-10-CM | POA: Diagnosis not present

## 2015-06-10 DIAGNOSIS — E118 Type 2 diabetes mellitus with unspecified complications: Secondary | ICD-10-CM | POA: Diagnosis not present

## 2015-06-10 DIAGNOSIS — E538 Deficiency of other specified B group vitamins: Secondary | ICD-10-CM

## 2015-06-10 DIAGNOSIS — M150 Primary generalized (osteo)arthritis: Secondary | ICD-10-CM

## 2015-06-10 DIAGNOSIS — Z23 Encounter for immunization: Secondary | ICD-10-CM | POA: Diagnosis not present

## 2015-06-10 DIAGNOSIS — M159 Polyosteoarthritis, unspecified: Secondary | ICD-10-CM

## 2015-06-10 LAB — COMPREHENSIVE METABOLIC PANEL
ALK PHOS: 86 U/L (ref 39–117)
ALT: 20 U/L (ref 0–35)
AST: 18 U/L (ref 0–37)
Albumin: 4.3 g/dL (ref 3.5–5.2)
BILIRUBIN TOTAL: 0.4 mg/dL (ref 0.2–1.2)
BUN: 10 mg/dL (ref 6–23)
CO2: 28 meq/L (ref 19–32)
Calcium: 9.5 mg/dL (ref 8.4–10.5)
Chloride: 104 mEq/L (ref 96–112)
Creatinine, Ser: 0.82 mg/dL (ref 0.40–1.20)
GFR: 77.77 mL/min (ref >60.00)
GLUCOSE: 202 mg/dL — AB (ref 70–99)
POTASSIUM: 4.8 meq/L (ref 3.5–5.1)
SODIUM: 138 meq/L (ref 135–145)
TOTAL PROTEIN: 7.2 g/dL (ref 6.0–8.3)

## 2015-06-10 LAB — HEMOGLOBIN A1C: HEMOGLOBIN A1C: 8.1 % — AB (ref 4.6–6.5)

## 2015-06-10 LAB — LIPID PANEL
CHOL/HDL RATIO: 5
Cholesterol: 270 mg/dL — ABNORMAL HIGH (ref 0–200)
HDL: 55.1 mg/dL (ref >39.00)
LDL Cholesterol: 179 mg/dL — ABNORMAL HIGH (ref 0–99)
NONHDL: 214.82
Triglycerides: 178 mg/dL — ABNORMAL HIGH (ref 0.0–149.0)
VLDL: 35.6 mg/dL (ref 0.0–40.0)

## 2015-06-10 LAB — TSH: TSH: 1.07 u[IU]/mL (ref 0.35–4.50)

## 2015-06-10 LAB — VITAMIN B12: Vitamin B-12: 285 pg/mL (ref 211–911)

## 2015-06-10 MED ORDER — HYDROCODONE-ACETAMINOPHEN 5-325 MG PO TABS: 1.0000 | ORAL_TABLET | Freq: Every evening | ORAL | Status: DC | PRN

## 2015-06-10 NOTE — Assessment & Plan Note (Signed)
Checking B12 level today, adjust as needed. Still doing injections. Has neuropathy which may or may not be related.

## 2015-06-10 NOTE — Assessment & Plan Note (Signed)
Referral to orthopedics and have asked her to consider synvisc injections as she refuses cortisone. She was not able to afford lidoderm patches. Increase vicodin to 2 pills prn at night time. Needs to see orthopedics by next visit.

## 2015-06-10 NOTE — Progress Notes (Signed)
Pre visit review using our clinic review tool, if applicable. No additional management support is needed unless otherwise documented below in the visit note. 

## 2015-06-10 NOTE — Assessment & Plan Note (Signed)
Taking synthroid and checking TSH today and adjust as needed.

## 2015-06-10 NOTE — Progress Notes (Signed)
   Subjective:    Patient ID: Shelia Leon, female    DOB: 01/13/1961, 54 y.o.   MRN: [id number]  HPI The patient is a 54 YO female coming in for follow up of medical problems: her osteoarthritis (worsening pain in her knees at night that wakes her up, vicodin 1 pill not working anymore occasionally takes 2 and able to sleep through the night, has not been to orthopedics and refuses cortisone shots), her diabetes (type 2 and reasonably controlled, takes metformin and on ACE-I, complicated by stable neuropathy), her thyroid (stable on levothyroxine dosing, no symptoms of chills, tremors, diarrhea, constipation, weight change). Concerns as above with worsening pain.   Review of Systems  Constitutional: Negative for fever, chills, activity change, appetite change and unexpected weight change.  HENT: Positive for congestion, postnasal drip, rhinorrhea and sore throat. Negative for ear discharge, ear pain, sneezing and trouble swallowing.   Eyes: Negative.   Respiratory: Positive for cough. Negative for chest tightness, shortness of breath and wheezing.   Cardiovascular: Negative for chest pain, palpitations and leg swelling.  Gastrointestinal: Negative for nausea, abdominal pain, diarrhea, constipation and abdominal distention.  Musculoskeletal: Positive for arthralgias and gait problem. Negative for myalgias, neck pain and neck stiffness.  Neurological: Positive for numbness. Negative for dizziness, weakness and headaches.      Objective:   Physical Exam  Constitutional: She is oriented to person, place, and time. She appears well-developed and well-nourished.  HENT:  Head: Normocephalic and atraumatic.  Oropharynx with mild redness, no nasal crusting.   Eyes: EOM are normal.  Neck: Normal range of motion.  Cardiovascular: Normal rate and regular rhythm.   Pulmonary/Chest: Effort normal and breath sounds normal. No respiratory distress. She has no wheezes. She has no rales.  Abdominal: Soft.  She exhibits no distension. There is no tenderness. There is no rebound.  Neurological: She is alert and oriented to person, place, and time. Coordination abnormal.  Uses cane for ambulation  Skin: Skin is warm and dry.   Filed Vitals:   06/10/15 0914  BP: 122/82  Pulse: 78  Temp: 98.2 F (36.8 C)  TempSrc: Oral  Resp: 14  Height: 5' 5.5" (1.664 m)  Weight: 153 lb (69.4 kg)  SpO2: 98%      Assessment & Plan:  Flu shot given at visit.

## 2015-06-10 NOTE — Assessment & Plan Note (Signed)
Checking HgA1c, taking metformin and lisinopril. Checking lipid panel and adjust as needed. Complicated by neuropathy.

## 2015-06-10 NOTE — Patient Instructions (Signed)
We will check the labs today, we have given you the new prescription for the pain medicine.   Before next visit we would like you to go back to an orthopedic doctor and talk to them about the artificial knee fluid injection (one brand is called synvisc).   Keep working on stopping cigarettes as they are harmful to your health.

## 2015-06-17 ENCOUNTER — Telehealth: Payer: Self-pay

## 2015-06-17 MED ORDER — GLIPIZIDE ER 10 MG PO TB24: 10.0000 mg | ORAL_TABLET | Freq: Every day | ORAL | Status: DC

## 2015-06-17 NOTE — Telephone Encounter (Signed)
Sent in glipizide to be taken 1 pill with breakfast. She needs to take with food.

## 2015-06-17 NOTE — Telephone Encounter (Signed)
Patient is aware of lab results and she is ok with you starting her on another medication for her diabetes. She uses the [facility] on [address].

## 2015-06-18 ENCOUNTER — Other Ambulatory Visit: Payer: Self-pay | Admitting: Internal Medicine

## 2015-07-15 ENCOUNTER — Telehealth: Payer: Self-pay | Admitting: *Deleted

## 2015-07-15 DIAGNOSIS — G629 Polyneuropathy, unspecified: Secondary | ICD-10-CM

## 2015-07-15 MED ORDER — HYDROCODONE-ACETAMINOPHEN 5-325 MG PO TABS: 1.0000 | ORAL_TABLET | Freq: Every evening | ORAL | Status: DC | PRN

## 2015-07-15 NOTE — Telephone Encounter (Signed)
Notified pt rx ready for pick-up.../lmb 

## 2015-07-15 NOTE — Telephone Encounter (Signed)
Printed and signed.  

## 2015-07-15 NOTE — Telephone Encounter (Signed)
Requesting refill on her hydrocodone.../lmb 

## 2015-08-05 ENCOUNTER — Other Ambulatory Visit: Payer: Self-pay | Admitting: Internal Medicine

## 2015-08-12 ENCOUNTER — Telehealth: Payer: Self-pay | Admitting: *Deleted

## 2015-08-12 DIAGNOSIS — G629 Polyneuropathy, unspecified: Secondary | ICD-10-CM

## 2015-08-12 MED ORDER — HYDROCODONE-ACETAMINOPHEN 5-325 MG PO TABS: 1.0000 | ORAL_TABLET | Freq: Every evening | ORAL | Status: DC | PRN

## 2015-08-12 NOTE — Telephone Encounter (Signed)
Left msg on triage requesting refill on hydrocodone.../lmb 

## 2015-08-12 NOTE — Telephone Encounter (Signed)
Printed and signed.  

## 2015-08-13 NOTE — Telephone Encounter (Signed)
Notified pt rx ready for pick-up.../lmb 

## 2015-09-12 ENCOUNTER — Telehealth: Payer: Self-pay | Admitting: *Deleted

## 2015-09-12 DIAGNOSIS — G629 Polyneuropathy, unspecified: Secondary | ICD-10-CM

## 2015-09-12 MED ORDER — HYDROCODONE-ACETAMINOPHEN 5-325 MG PO TABS: 1.0000 | ORAL_TABLET | Freq: Every evening | ORAL | Status: DC | PRN

## 2015-09-12 NOTE — Telephone Encounter (Signed)
OK to fill this prescription with additional refills x0 Sch OV w/PCP Thank you!  

## 2015-09-12 NOTE — Telephone Encounter (Signed)
Called pt no answer LMOM rx ready for pick-up.../lmb 

## 2015-09-12 NOTE — Telephone Encounter (Signed)
Left msg on triage needing refill on her Hydrocodone. MD is out of the office today & next week. Pls advise on refill...[name]/lmb

## 2015-10-13 ENCOUNTER — Telehealth: Payer: Self-pay | Admitting: *Deleted

## 2015-10-13 DIAGNOSIS — G629 Polyneuropathy, unspecified: Secondary | ICD-10-CM

## 2015-10-13 MED ORDER — HYDROCODONE-ACETAMINOPHEN 5-325 MG PO TABS: 1.0000 | ORAL_TABLET | Freq: Every evening | ORAL | Status: DC | PRN

## 2015-10-13 NOTE — Telephone Encounter (Signed)
Notified pt rx ready for pick-up.../lmb 

## 2015-10-13 NOTE — Telephone Encounter (Signed)
Printed and signed. Needs UDS at pickup.  

## 2015-10-13 NOTE — Telephone Encounter (Signed)
Receive call pt is requesting refill on her Hydrocodone.../lmb 

## 2015-11-11 ENCOUNTER — Encounter: Payer: Self-pay | Admitting: Internal Medicine

## 2015-11-12 ENCOUNTER — Telehealth: Payer: Self-pay | Admitting: *Deleted

## 2015-11-12 DIAGNOSIS — G629 Polyneuropathy, unspecified: Secondary | ICD-10-CM

## 2015-11-12 NOTE — Telephone Encounter (Signed)
Left msg on triage requesting refill on Hydrocodone.../lmb 

## 2015-11-13 MED ORDER — HYDROCODONE-ACETAMINOPHEN 5-325 MG PO TABS: 1.0000 | ORAL_TABLET | Freq: Every evening | ORAL | Status: DC | PRN

## 2015-11-13 NOTE — Telephone Encounter (Signed)
Notified pt rx ready for pick-up.../lmb 

## 2015-11-13 NOTE — Telephone Encounter (Signed)
Printed and signed but needs visit for control of her diabetes for any more refills.

## 2015-12-09 ENCOUNTER — Ambulatory Visit (INDEPENDENT_AMBULATORY_CARE_PROVIDER_SITE_OTHER): Payer: Medicare Other | Admitting: Internal Medicine

## 2015-12-09 ENCOUNTER — Encounter: Payer: Self-pay | Admitting: Internal Medicine

## 2015-12-09 ENCOUNTER — Other Ambulatory Visit (INDEPENDENT_AMBULATORY_CARE_PROVIDER_SITE_OTHER): Payer: Medicare Other

## 2015-12-09 VITALS — BP 112/74 | HR 64 | Temp 98.7°F | Resp 14 | Ht 65.5 in | Wt 151.1 lb

## 2015-12-09 DIAGNOSIS — M542 Cervicalgia: Secondary | ICD-10-CM

## 2015-12-09 DIAGNOSIS — E039 Hypothyroidism, unspecified: Secondary | ICD-10-CM | POA: Diagnosis not present

## 2015-12-09 DIAGNOSIS — G629 Polyneuropathy, unspecified: Secondary | ICD-10-CM

## 2015-12-09 DIAGNOSIS — E119 Type 2 diabetes mellitus without complications: Secondary | ICD-10-CM

## 2015-12-09 DIAGNOSIS — M79605 Pain in left leg: Secondary | ICD-10-CM | POA: Diagnosis not present

## 2015-12-09 DIAGNOSIS — M79604 Pain in right leg: Secondary | ICD-10-CM

## 2015-12-09 LAB — LIPID PANEL
CHOLESTEROL: 150 mg/dL (ref 0–200)
HDL: 53.5 mg/dL (ref >39.00)
LDL Cholesterol: 75 mg/dL (ref 0–99)
NONHDL: 96.18
Total CHOL/HDL Ratio: 3
Triglycerides: 105 mg/dL (ref 0.0–149.0)
VLDL: 21 mg/dL (ref 0.0–40.0)

## 2015-12-09 LAB — T4, FREE: Free T4: 1.14 ng/dL (ref 0.60–1.60)

## 2015-12-09 LAB — TSH: TSH: 0.97 u[IU]/mL (ref 0.35–4.50)

## 2015-12-09 LAB — HEMOGLOBIN A1C: HEMOGLOBIN A1C: 7.2 % — AB (ref 4.6–6.5)

## 2015-12-09 LAB — CK: CK TOTAL: 191 U/L — AB (ref 7–177)

## 2015-12-09 MED ORDER — GLUCOSE BLOOD VI STRP: ORAL_STRIP | Status: DC

## 2015-12-09 MED ORDER — LANCETS MISC: Status: DC

## 2015-12-09 MED ORDER — HYDROCODONE-ACETAMINOPHEN 5-325 MG PO TABS: 1.0000 | ORAL_TABLET | Freq: Every evening | ORAL | Status: DC | PRN

## 2015-12-09 NOTE — Patient Instructions (Signed)
We will check the labs today. We will likely send in a lower strength of the glipizide since this is the medicine that can send the sugars low. We will send the results on mychart.   Diabetes and Standards of Medical Care Diabetes is complicated. You may find that your diabetes team includes a dietitian, nurse, diabetes educator, eye doctor, and more. To help everyone know what is going on and to help you get the care you deserve, the following schedule of care was developed to help keep you on track. Below are the tests, exams, vaccines, medicines, education, and plans you will need. HbA1c test This test shows how well you have controlled your glucose over the past 2-3 months. It is used to see if your diabetes management plan needs to be adjusted.   It is performed at least 2 times a year if you are meeting treatment goals.  It is performed 4 times a year if therapy has changed or if you are not meeting treatment goals. Blood pressure test  This test is performed at every routine medical visit. The goal is less than 140/90 mm Hg for most people, but 130/80 mm Hg in some cases. Ask your health care provider about your goal. Dental exam  Follow up with the dentist regularly. Eye exam  If you are diagnosed with type 1 diabetes as a child, get an exam upon reaching the age of 21 years or older and having had diabetes for 3-5 years. Yearly eye exams are recommended after that initial eye exam.  If you are diagnosed with type 1 diabetes as an adult, get an exam within 5 years of diagnosis and then yearly.  If you are diagnosed with type 2 diabetes, get an exam as soon as possible after the diagnosis and then yearly. Foot care exam  Visual foot exams are performed at every routine medical visit. The exams check for cuts, injuries, or other problems with the feet.  You should have a complete foot exam performed every year. This exam includes an inspection of the structure and skin of your feet,  a check of the pulses in your feet, and a check of the sensation in your feet.  Type 1 diabetes: The first exam is performed 5 years after diagnosis.  Type 2 diabetes: The first exam is performed at the time of diagnosis.  Check your feet nightly for cuts, injuries, or other problems with your feet. Tell your health care provider if anything is not healing. Kidney function test (urine microalbumin)  This test is performed once a year.  Type 1 diabetes: The first test is performed 5 years after diagnosis.  Type 2 diabetes: The first test is performed at the time of diagnosis.  A serum creatinine and estimated glomerular filtration rate (eGFR) test is done once a year to assess the level of chronic kidney disease (CKD), if present. Lipid profile (cholesterol, HDL, LDL, triglycerides)  Performed every 5 years for most people.  The goal for LDL is less than 100 mg/dL. If you are at high risk, the goal is less than 70 mg/dL.  The goal for HDL is 40 mg/dL-50 mg/dL for men and 50 mg/dL-60 mg/dL for women. An HDL cholesterol of 60 mg/dL or higher gives some protection against heart disease.  The goal for triglycerides is less than 150 mg/dL. Immunizations  The flu (influenza) vaccine is recommended yearly for every person 70 months of age or older who has diabetes.  The pneumonia (pneumococcal) vaccine  is recommended for every person 63 years of age or older who has diabetes. Adults 42 years of age or older may receive the pneumonia vaccine as a series of two separate shots.  The hepatitis B vaccine is recommended for adults shortly after they have been diagnosed with diabetes.  The Tdap (tetanus, diphtheria, and pertussis) vaccine should be given:  According to normal childhood vaccination schedules, for children.  Every 10 years, for adults who have diabetes. Diabetes self-management education  Education is recommended at diagnosis and ongoing as needed. Treatment plan  Your  treatment plan is reviewed at every medical visit.   This information is not intended to replace advice given to you by your health care provider. Make sure you discuss any questions you have with your health care provider.   Document Released: 03/28/2009 Document Revised: 06/21/2014 Document Reviewed: 10/31/2012 Elsevier Interactive Patient Education [employer].

## 2015-12-09 NOTE — Progress Notes (Signed)
Pre visit review using our clinic review tool, if applicable. No additional management support is needed unless otherwise documented below in the visit note. 

## 2015-12-09 NOTE — Assessment & Plan Note (Signed)
Checking CK, taking lipitor. Adjust as needed.

## 2015-12-09 NOTE — Progress Notes (Signed)
   Subjective:    Patient ID: Shelia Leon, female    DOB: 09/07/1960, 55 y.o.   MRN: [id number]  HPI The patient is a 55 YO female coming in for follow up of her diabetes (not complicated, taking glipizide and metformin, eating has changed since last visit and having low sugars every 3-4 days, able to tell the symptoms and eat something, no new foot symptoms) and her thyroid (taking her synthroid regularly, denies heat or cold intolerance, no weight change), and her new neck pain (denies injury, some pain in the neck and some stinging pains, started 1-2 months ago, not radiating to her arms or jaw, no weakness or numbness, taking naproxen which is effective).    Review of Systems  Constitutional: Negative for fever, chills, activity change, appetite change and unexpected weight change.  Eyes: Negative.   Respiratory: Negative for cough, chest tightness, shortness of breath and wheezing.   Cardiovascular: Negative for chest pain, palpitations and leg swelling.  Gastrointestinal: Negative for nausea, abdominal pain, diarrhea, constipation and abdominal distention.  Musculoskeletal: Positive for arthralgias, gait problem and neck pain. Negative for myalgias and neck stiffness.  Neurological: Negative for dizziness, weakness and headaches.  Psychiatric/Behavioral: Negative.       Objective:   Physical Exam  Constitutional: She is oriented to person, place, and time. She appears well-developed and well-nourished.  HENT:  Head: Normocephalic and atraumatic.  Eyes: EOM are normal.  Neck: Normal range of motion.  Cardiovascular: Normal rate and regular rhythm.   Pulmonary/Chest: Effort normal and breath sounds normal. No respiratory distress. She has no wheezes. She has no rales.  Abdominal: Soft. She exhibits no distension. There is no tenderness. There is no rebound.  Neurological: She is alert and oriented to person, place, and time. Coordination abnormal.  Uses cane for ambulation  Skin:  Skin is warm and dry.  See foot exam   Filed Vitals:   12/09/15 0828  BP: 112/74  Pulse: 64  Temp: 98.7 F (37.1 C)  TempSrc: Oral  Resp: 14  Height: 5' 5.5" (1.664 m)  Weight: 151 lb 1.9 oz (68.548 kg)  SpO2: 91%      Assessment & Plan:

## 2015-12-09 NOTE — Assessment & Plan Note (Signed)
States she has had MRI with neurology and will return to them. Controlled with naproxen and no indication for further imaging or treatment at this time.

## 2015-12-09 NOTE — Assessment & Plan Note (Signed)
Foot exam done, taking metformin and glipizide. May need to decrease glipizide since she is having lows. Reminded about eye exam yearly. On statin and ACE-I.

## 2015-12-09 NOTE — Assessment & Plan Note (Signed)
Checking TSH and free T4, adjust synthroid 125 mcg daily as needed.

## 2015-12-12 ENCOUNTER — Other Ambulatory Visit: Payer: Self-pay | Admitting: *Deleted

## 2015-12-12 MED ORDER — ONETOUCH ULTRA 2 W/DEVICE KIT: PACK | Status: DC

## 2015-12-12 MED ORDER — ONETOUCH DELICA LANCETS 33G MISC: Status: DC

## 2015-12-12 MED ORDER — GLUCOSE BLOOD VI STRP: 1.0000 | ORAL_STRIP | Freq: Three times a day (TID) | Status: DC

## 2015-12-17 ENCOUNTER — Other Ambulatory Visit: Payer: Self-pay | Admitting: Internal Medicine

## 2015-12-17 ENCOUNTER — Other Ambulatory Visit: Payer: Self-pay | Admitting: Neurology

## 2015-12-17 NOTE — Telephone Encounter (Signed)
This patient is to f/u PRN. Please advise on how to handle medication refill.

## 2015-12-23 ENCOUNTER — Other Ambulatory Visit: Payer: Self-pay | Admitting: *Deleted

## 2015-12-23 NOTE — Telephone Encounter (Signed)
Duplicate request rx was sent on 12/12/15...[name]/lmb

## 2015-12-26 ENCOUNTER — Other Ambulatory Visit: Payer: Self-pay | Admitting: Internal Medicine

## 2016-01-12 ENCOUNTER — Telehealth: Payer: Self-pay | Admitting: *Deleted

## 2016-01-12 DIAGNOSIS — G629 Polyneuropathy, unspecified: Secondary | ICD-10-CM

## 2016-01-12 MED ORDER — HYDROCODONE-ACETAMINOPHEN 5-325 MG PO TABS: 1.0000 | ORAL_TABLET | Freq: Every evening | ORAL | 0 refills | Status: DC | PRN

## 2016-01-12 NOTE — Telephone Encounter (Signed)
Rec'd call pt requesting refill on her hydrocodone.../lmb 

## 2016-01-12 NOTE — Telephone Encounter (Signed)
Printed and signed.  

## 2016-01-12 NOTE — Telephone Encounter (Signed)
Advised patient rx for hydrocodone is ready for pick up

## 2016-01-12 NOTE — Telephone Encounter (Signed)
Advised patient that rx for hydrocodone is ready for pick up

## 2016-01-13 ENCOUNTER — Other Ambulatory Visit: Payer: Self-pay | Admitting: Internal Medicine

## 2016-01-13 DIAGNOSIS — Z1231 Encounter for screening mammogram for malignant neoplasm of breast: Secondary | ICD-10-CM

## 2016-01-29 ENCOUNTER — Ambulatory Visit
Admission: RE | Admit: 2016-01-29 | Discharge: 2016-01-29 | Disposition: A | Discharge status: Home / Self Care | Payer: Medicare Other | Source: Ambulatory Visit | Attending: Internal Medicine | Admitting: Internal Medicine

## 2016-01-29 ENCOUNTER — Encounter: Payer: Self-pay | Admitting: Radiology

## 2016-01-29 DIAGNOSIS — Z1231 Encounter for screening mammogram for malignant neoplasm of breast: Secondary | ICD-10-CM

## 2016-01-30 ENCOUNTER — Other Ambulatory Visit: Payer: Self-pay | Admitting: Internal Medicine

## 2016-02-06 ENCOUNTER — Other Ambulatory Visit: Payer: Self-pay | Admitting: Internal Medicine

## 2016-02-10 ENCOUNTER — Ambulatory Visit (INDEPENDENT_AMBULATORY_CARE_PROVIDER_SITE_OTHER): Payer: Medicare Other

## 2016-02-10 ENCOUNTER — Ambulatory Visit: Payer: Medicare Other

## 2016-02-10 ENCOUNTER — Other Ambulatory Visit: Payer: Self-pay | Admitting: Internal Medicine

## 2016-02-10 DIAGNOSIS — Z23 Encounter for immunization: Secondary | ICD-10-CM

## 2016-02-10 DIAGNOSIS — G629 Polyneuropathy, unspecified: Secondary | ICD-10-CM

## 2016-02-10 MED ORDER — HYDROCODONE-ACETAMINOPHEN 5-325 MG PO TABS: 1.0000 | ORAL_TABLET | Freq: Every evening | ORAL | 0 refills | Status: DC | PRN

## 2016-03-12 ENCOUNTER — Telehealth: Payer: Self-pay | Admitting: *Deleted

## 2016-03-12 DIAGNOSIS — G629 Polyneuropathy, unspecified: Secondary | ICD-10-CM

## 2016-03-12 MED ORDER — HYDROCODONE-ACETAMINOPHEN 5-325 MG PO TABS: 1.0000 | ORAL_TABLET | Freq: Every evening | ORAL | 0 refills | Status: DC | PRN

## 2016-03-12 NOTE — Telephone Encounter (Signed)
Printed and signed.  

## 2016-03-12 NOTE — Telephone Encounter (Signed)
Notified pt rx ready for pick-up.../lmb 

## 2016-03-12 NOTE — Telephone Encounter (Signed)
Rec'd call pt requesting refill on Hydrocodone.../lmb 

## 2016-03-13 IMAGING — MR MR THORACIC SPINE WO/W CM
4 of 8 series · 18 of 48 positions shown · IV contrast (multihance)
Comparison: None.

CLINICAL DATA: Mid back pain. Bilateral leg weakness and numbness.
Unsteady gait and muscle spasticity with hyperreflexia. Symptoms are
chronic and worsening.

EXAM:
MRI THORACIC SPINE WITHOUT AND WITH CONTRAST
TECHNIQUE: Multiplanar and multiecho pulse sequences of the thoracic spine were
obtained without and with intravenous contrast.
CONTRAST:  15 mL MULTIHANCE GADOBENATE DIMEGLUMINE 529 MG/ML IV SOLN

[Series 5: T2 · sagittal · 3.0mm · 0.48mm/px · 3 of 13 slices shown (1 of 2)]
[im 1/13]
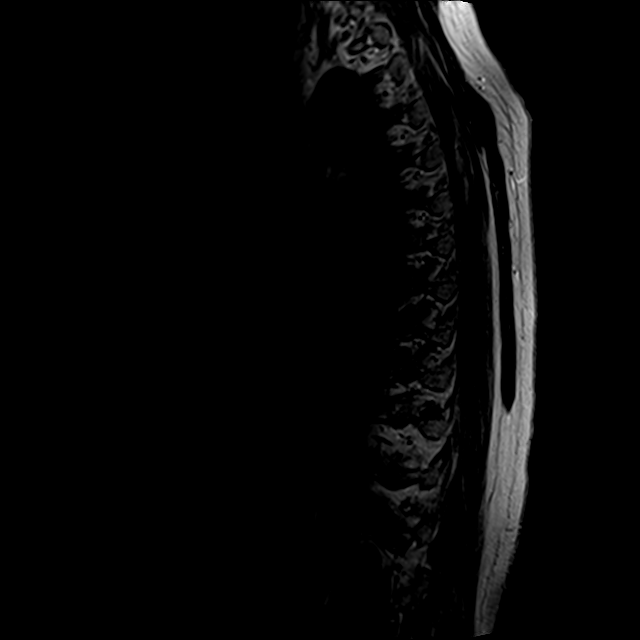
[im 7/13]
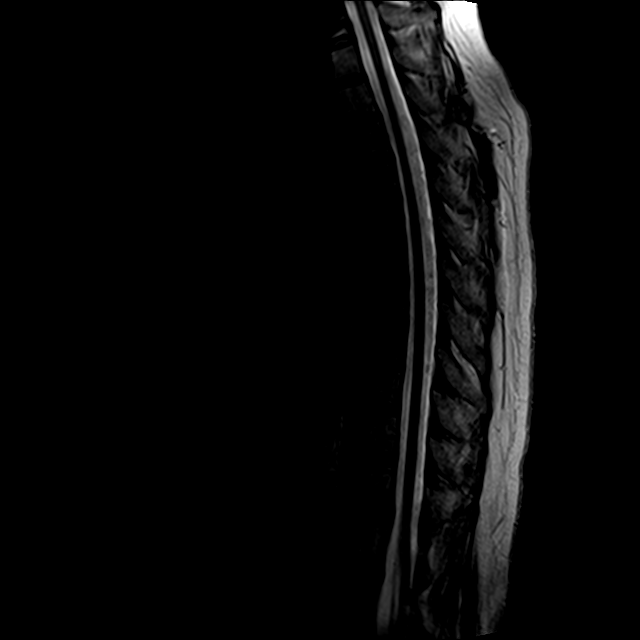
[im 13/13]
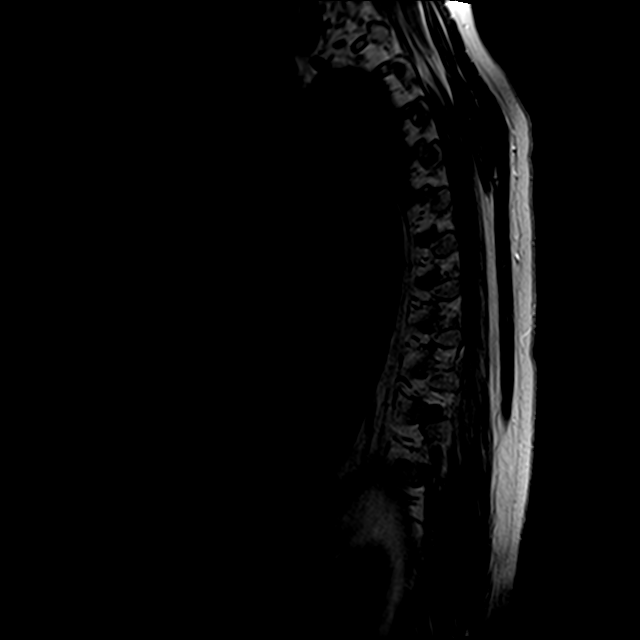

[Series 6: T1 · sagittal · 3.0mm · 0.48mm/px · 3 of 13 slices shown (1 of 2)]
[im 1/13]
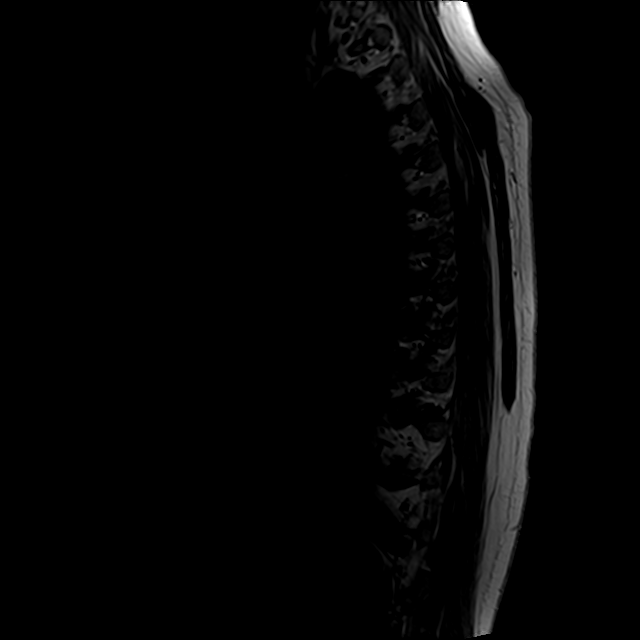
[im 7/13]
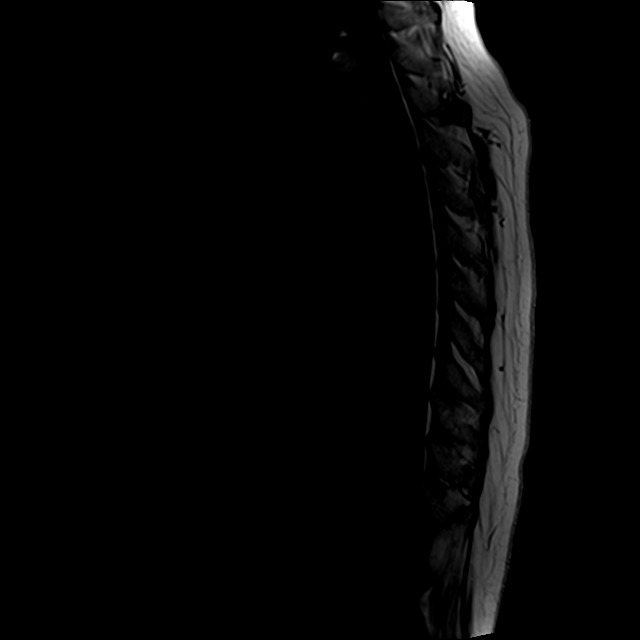
[im 13/13]
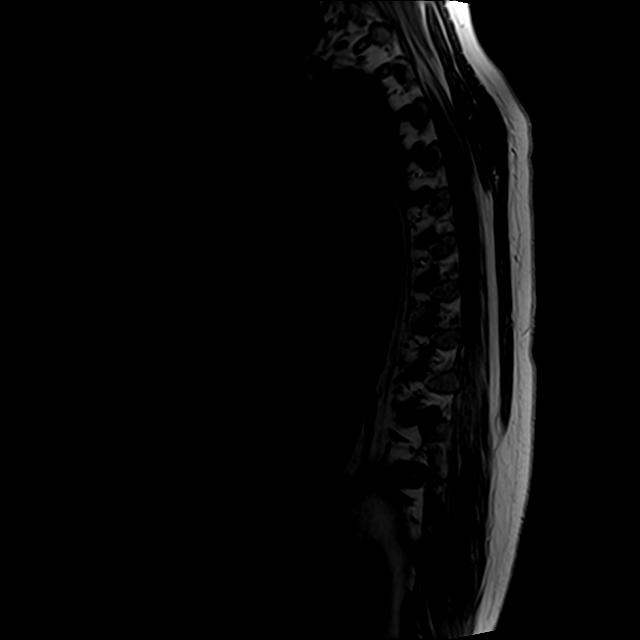

[Series 8: T2 · axial · 3.5mm · 0.39mm/px · z∈[-271,-56]mm · 9 of 36 slices shown (2 of 2)]
[im 1/36]
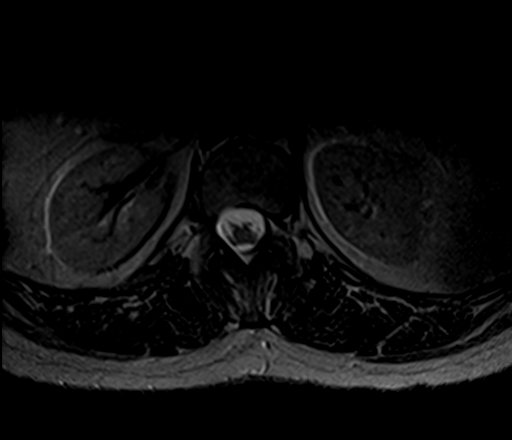
[im 5/36]
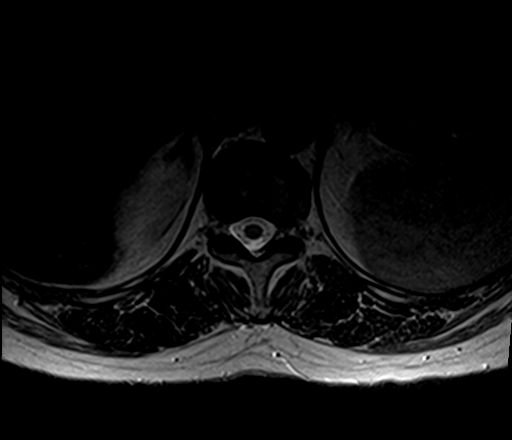
[im 9/36]
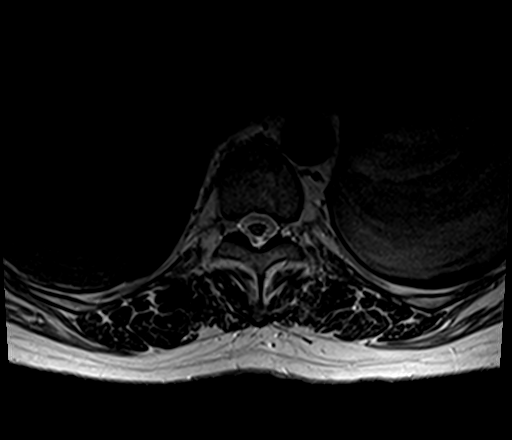
[im 14/36]
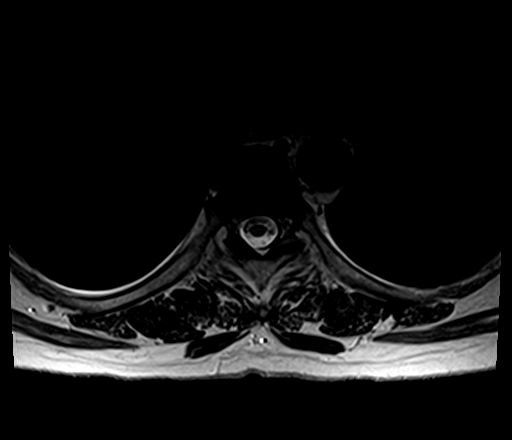
[im 18/36]
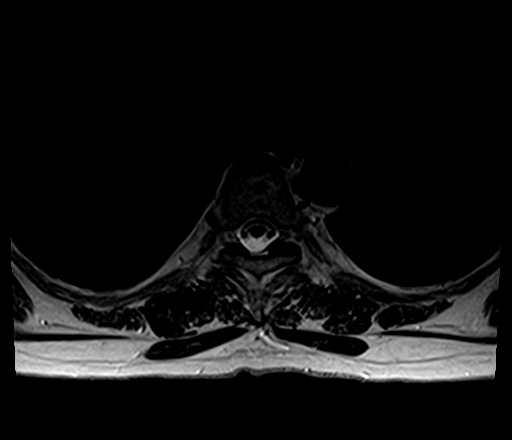
[im 22/36]
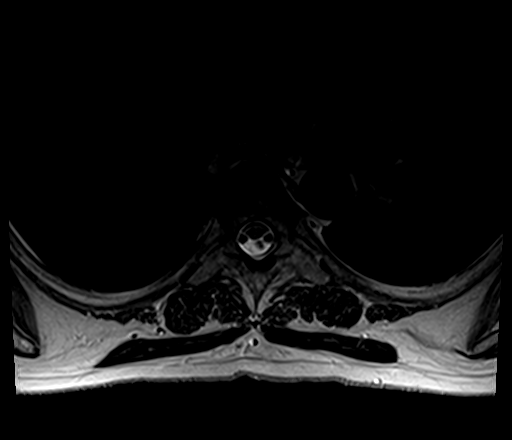
[im 27/36]
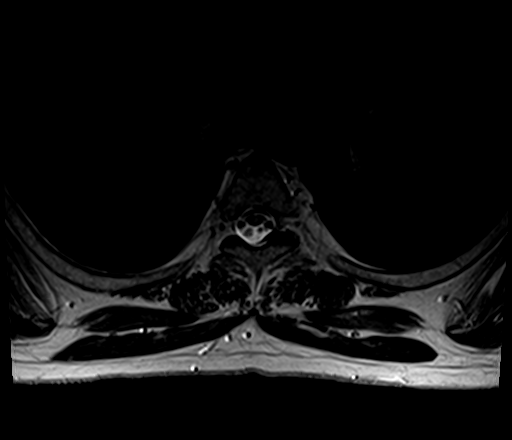
[im 31/36]
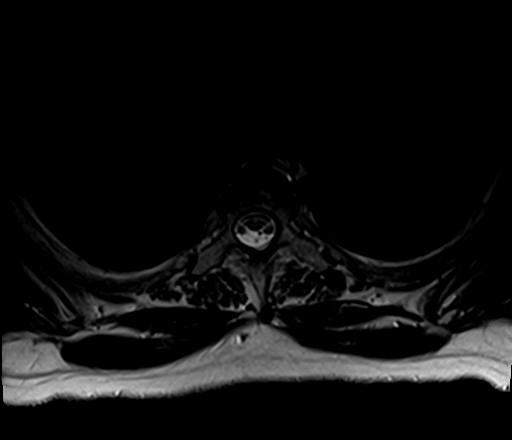
[im 36/36]
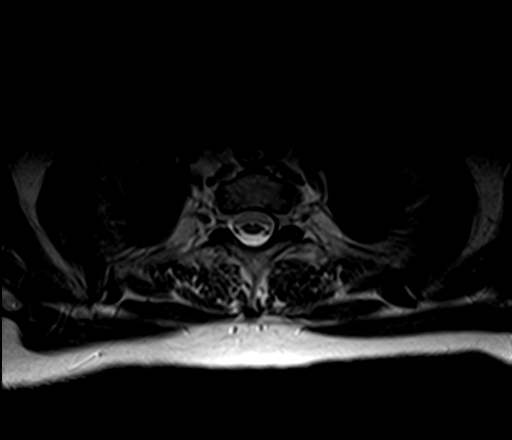

[Series 10: T1 · axial · 3.5mm · 0.39mm/px · z∈[-238,-87]mm · 3 of 36 slices shown (2 of 2)]
[im 5/36]
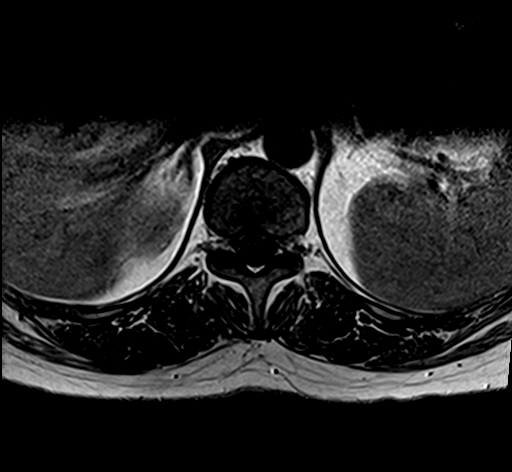
[im 18/36]
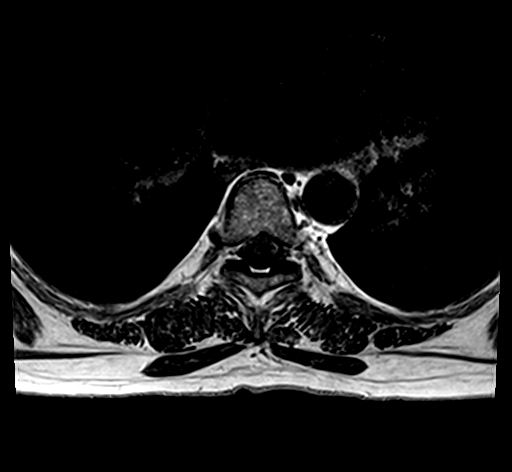
[im 31/36]
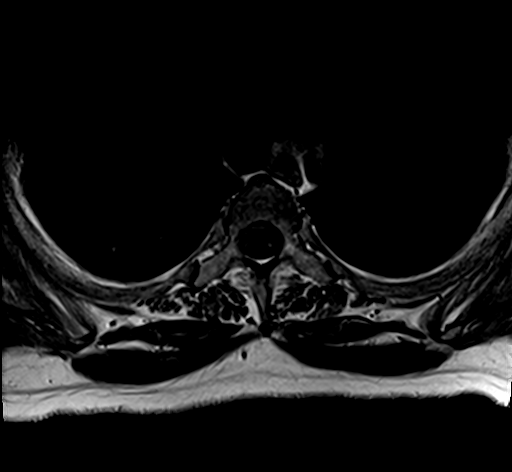

[18 of 48 positions shown; findings below may reference images not displayed]

FINDINGS: [facility] imaging includes a wide field of view T1 weighted sagittal
sequence and demonstrates postoperative change of cervical fusion
which appears to extend from C3-C7. No focal abnormality is seen on
[facility] imaging.

Vertebral body height, signal and alignment are maintained
throughout the thoracic spine. The thoracic cord demonstrates normal
size, shape and signal throughout. There is no pathologic
enhancement after contrast administration. The central spinal canal
and neural foramina are widely patent at all levels. No disc bulge
or protrusion is identified. Imaged paraspinous structures are
unremarkable.
IMPRESSION: Normal-appearing thoracic spine.

Status post cervical fusion as seen on [facility] imaging.

## 2016-04-12 ENCOUNTER — Telehealth: Payer: Self-pay | Admitting: *Deleted

## 2016-04-12 DIAGNOSIS — G629 Polyneuropathy, unspecified: Secondary | ICD-10-CM

## 2016-04-12 MED ORDER — HYDROCODONE-ACETAMINOPHEN 5-325 MG PO TABS: 1.0000 | ORAL_TABLET | Freq: Every evening | ORAL | 0 refills | Status: DC | PRN

## 2016-04-12 NOTE — Telephone Encounter (Signed)
Rec'd call pt requesting refill on Hydrocodone.../lmb 

## 2016-04-12 NOTE — Telephone Encounter (Signed)
Notified pt rx ready for pick-up.../lmb 

## 2016-04-17 ENCOUNTER — Other Ambulatory Visit: Payer: Self-pay | Admitting: Internal Medicine

## 2016-05-14 ENCOUNTER — Telehealth: Payer: Self-pay | Admitting: Internal Medicine

## 2016-05-14 DIAGNOSIS — G629 Polyneuropathy, unspecified: Secondary | ICD-10-CM

## 2016-05-14 MED ORDER — HYDROCODONE-ACETAMINOPHEN 5-325 MG PO TABS: 1.0000 | ORAL_TABLET | Freq: Every evening | ORAL | 0 refills | Status: DC | PRN

## 2016-05-14 NOTE — Telephone Encounter (Signed)
HYDROcodone-acetaminophen (NORCO/VICODIN) 5-325 MG tablet   Patient is requesting a refill on this medication. Please follow up. Thank you.

## 2016-05-17 NOTE — Telephone Encounter (Signed)
Contacted pt and informed that rx is ready for pick up.

## 2016-06-05 ENCOUNTER — Other Ambulatory Visit: Payer: Self-pay | Admitting: Internal Medicine

## 2016-06-08 NOTE — Telephone Encounter (Signed)
I talked with patient---she has enough meds to last until visit on Thursday---I have added refill request to appt note on 12/28 and patient advised to also request these refills when she comes to appt

## 2016-06-08 NOTE — Telephone Encounter (Signed)
Patient has had labs in June/2017, but abnormal a1c---routing to dr crawford--are you ok with refilling meds or do you need office visit---please advise, thanks

## 2016-06-08 NOTE — Telephone Encounter (Signed)
Coming in thursday, can this wait until then?

## 2016-06-10 ENCOUNTER — Ambulatory Visit (INDEPENDENT_AMBULATORY_CARE_PROVIDER_SITE_OTHER): Payer: Medicare Other | Admitting: Internal Medicine

## 2016-06-10 ENCOUNTER — Encounter: Payer: Self-pay | Admitting: Internal Medicine

## 2016-06-10 ENCOUNTER — Other Ambulatory Visit (INDEPENDENT_AMBULATORY_CARE_PROVIDER_SITE_OTHER): Payer: Medicare Other

## 2016-06-10 VITALS — BP 110/70 | HR 73 | Temp 98.3°F | Resp 12 | Ht 65.5 in | Wt 152.0 lb

## 2016-06-10 DIAGNOSIS — Z720 Tobacco use: Secondary | ICD-10-CM

## 2016-06-10 DIAGNOSIS — G629 Polyneuropathy, unspecified: Secondary | ICD-10-CM

## 2016-06-10 DIAGNOSIS — E039 Hypothyroidism, unspecified: Secondary | ICD-10-CM

## 2016-06-10 DIAGNOSIS — Z0000 Encounter for general adult medical examination without abnormal findings: Secondary | ICD-10-CM

## 2016-06-10 DIAGNOSIS — E538 Deficiency of other specified B group vitamins: Secondary | ICD-10-CM

## 2016-06-10 DIAGNOSIS — E119 Type 2 diabetes mellitus without complications: Secondary | ICD-10-CM | POA: Diagnosis not present

## 2016-06-10 LAB — CBC
HEMATOCRIT: 37.7 % (ref 36.0–46.0)
Hemoglobin: 13.3 g/dL (ref 12.0–15.0)
MCHC: 35.3 g/dL (ref 30.0–36.0)
MCV: 93.1 fl (ref 78.0–100.0)
Platelets: 199 10*3/uL (ref 150.0–400.0)
RBC: 4.05 Mil/uL (ref 3.87–5.11)
RDW: 12.3 % (ref 11.5–15.5)
WBC: 11.7 10*3/uL — AB (ref 4.0–10.5)

## 2016-06-10 LAB — COMPREHENSIVE METABOLIC PANEL
ALK PHOS: 97 U/L (ref 39–117)
ALT: 29 U/L (ref 0–35)
AST: 20 U/L (ref 0–37)
Albumin: 4.3 g/dL (ref 3.5–5.2)
BUN: 8 mg/dL (ref 6–23)
CO2: 30 mEq/L (ref 19–32)
CREATININE: 0.85 mg/dL (ref 0.40–1.20)
Calcium: 9.3 mg/dL (ref 8.4–10.5)
Chloride: 104 mEq/L (ref 96–112)
GFR: 74.32 mL/min (ref >60.00)
GLUCOSE: 149 mg/dL — AB (ref 70–99)
POTASSIUM: 5 meq/L (ref 3.5–5.1)
SODIUM: 139 meq/L (ref 135–145)
TOTAL PROTEIN: 7.2 g/dL (ref 6.0–8.3)
Total Bilirubin: 0.5 mg/dL (ref 0.2–1.2)

## 2016-06-10 LAB — LIPID PANEL
Cholesterol: 151 mg/dL (ref 0–200)
HDL: 54.9 mg/dL (ref >39.00)
LDL CALC: 76 mg/dL (ref 0–99)
NONHDL: 96
Total CHOL/HDL Ratio: 3
Triglycerides: 100 mg/dL (ref 0.0–149.0)
VLDL: 20 mg/dL (ref 0.0–40.0)

## 2016-06-10 LAB — VITAMIN B12: VITAMIN B 12: 212 pg/mL (ref 211–911)

## 2016-06-10 LAB — TSH: TSH: 0.39 u[IU]/mL (ref 0.35–4.50)

## 2016-06-10 MED ORDER — HYDROCODONE-ACETAMINOPHEN 5-325 MG PO TABS: 1.0000 | ORAL_TABLET | Freq: Every evening | ORAL | 0 refills | Status: DC | PRN

## 2016-06-10 MED ORDER — GLIPIZIDE ER 10 MG PO TB24: 10.0000 mg | ORAL_TABLET | Freq: Every day | ORAL | 3 refills | Status: DC

## 2016-06-10 MED ORDER — LISINOPRIL 5 MG PO TABS: 5.0000 mg | ORAL_TABLET | Freq: Every day | ORAL | 3 refills | Status: DC

## 2016-06-10 NOTE — Assessment & Plan Note (Addendum)
Mammogram and colonoscopy up to date. Flu and tetanus and pneumonia up to date. Counseled on need for sun safety and mole surveillance. Given 10 year screening recommendations.

## 2016-06-10 NOTE — Assessment & Plan Note (Signed)
Counseled on the need to quit smoking. Explained the risks and harms from tobacco smoke and she will think about it. She has been trying to cut back some.

## 2016-06-10 NOTE — Patient Instructions (Addendum)
We have done the refills today and will check the labs and send you the results.   Health Maintenance, Female Introduction Adopting a healthy lifestyle and getting preventive care can go a long way to promote health and wellness. Talk with your health care provider about what schedule of regular examinations is right for you. This is a good chance for you to check in with your provider about disease prevention and staying healthy. In between checkups, there are plenty of things you can do on your own. Experts have done a lot of research about which lifestyle changes and preventive measures are most likely to keep you healthy. Ask your health care provider for more information. Weight and diet Eat a healthy diet  Be sure to include plenty of vegetables, fruits, low-fat dairy products, and lean protein.  Do not eat a lot of foods high in solid fats, added sugars, or salt.  Get regular exercise. This is one of the most important things you can do for your health.  Most adults should exercise for at least 150 minutes each week. The exercise should increase your heart rate and make you sweat (moderate-intensity exercise).  Most adults should also do strengthening exercises at least twice a week. This is in addition to the moderate-intensity exercise. Maintain a healthy weight  Body mass index (BMI) is a measurement that can be used to identify possible weight problems. It estimates body fat based on height and weight. Your health care provider can help determine your BMI and help you achieve or maintain a healthy weight.  For females 71 years of age and older:  A BMI below 18.5 is considered underweight.  A BMI of 18.5 to 24.9 is normal.  A BMI of 25 to 29.9 is considered overweight.  A BMI of 30 and above is considered obese. Watch levels of cholesterol and blood lipids  You should start having your blood tested for lipids and cholesterol at 53 years of age, then have this test every 5  years.  You may need to have your cholesterol levels checked more often if:  Your lipid or cholesterol levels are high.  You are older than 53 years of age.  You are at high risk for heart disease. Cancer screening Lung Cancer  Lung cancer screening is recommended for adults 53-45 years old who are at high risk for lung cancer because of a history of smoking.  A yearly low-dose CT scan of the lungs is recommended for people who:  Currently smoke.  Have quit within the past 15 years.  Have at least a 30-pack-year history of smoking. A pack year is smoking an average of one pack of cigarettes a day for 1 year.  Yearly screening should continue until it has been 15 years since you quit.  Yearly screening should stop if you develop a health problem that would prevent you from having lung cancer treatment. Breast Cancer  Practice breast self-awareness. This means understanding how your breasts normally appear and feel.  It also means doing regular breast self-exams. Let your health care provider know about any changes, no matter how small.  If you are in your 20s or 30s, you should have a clinical breast exam (CBE) by a health care provider every 1-3 years as part of a regular health exam.  If you are 53 or older, have a CBE every year. Also consider having a breast X-ray (mammogram) every year.  If you have a family history of breast cancer, talk  to your health care provider about genetic screening.  If you are at high risk for breast cancer, talk to your health care provider about having an MRI and a mammogram every year.  Breast cancer gene (BRCA) assessment is recommended for women who have family members with BRCA-related cancers. BRCA-related cancers include:  Breast.  Ovarian.  Tubal.  Peritoneal cancers.  Results of the assessment will determine the need for genetic counseling and BRCA1 and BRCA2 testing. Cervical Cancer  Your health care provider may recommend  that you be screened regularly for cancer of the pelvic organs (ovaries, uterus, and vagina). This screening involves a pelvic examination, including checking for microscopic changes to the surface of your cervix (Pap test). You may be encouraged to have this screening done every 3 years, beginning at age 21.  For women ages 75-65, health care providers may recommend pelvic exams and Pap testing every 3 years, or they may recommend the Pap and pelvic exam, combined with testing for human papilloma virus (HPV), every 5 years. Some types of HPV increase your risk of cervical cancer. Testing for HPV may also be done on women of any age with unclear Pap test results.  Other health care providers may not recommend any screening for nonpregnant women who are considered low risk for pelvic cancer and who do not have symptoms. Ask your health care provider if a screening pelvic exam is right for you.  If you have had past treatment for cervical cancer or a condition that could lead to cancer, you need Pap tests and screening for cancer for at least 20 years after your treatment. If Pap tests have been discontinued, your risk factors (such as having a new sexual partner) need to be reassessed to determine if screening should resume. Some women have medical problems that increase the chance of getting cervical cancer. In these cases, your health care provider may recommend more frequent screening and Pap tests. Colorectal Cancer  This type of cancer can be detected and often prevented.  Routine colorectal cancer screening usually begins at 53 years of age and continues through 53 years of age.  Your health care provider may recommend screening at an earlier age if you have risk factors for colon cancer.  Your health care provider may also recommend using home test kits to check for hidden blood in the stool.  A small camera at the end of a tube can be used to examine your colon directly (sigmoidoscopy or  colonoscopy). This is done to check for the earliest forms of colorectal cancer.  Routine screening usually begins at age 53.  Direct examination of the colon should be repeated every 5-10 years through 53 years of age. However, you may need to be screened more often if early forms of precancerous polyps or small growths are found. Skin Cancer  Check your skin from head to toe regularly.  Tell your health care provider about any new moles or changes in moles, especially if there is a change in a mole's shape or color.  Also tell your health care provider if you have a mole that is larger than the size of a pencil eraser.  Always use sunscreen. Apply sunscreen liberally and repeatedly throughout the day.  Protect yourself by wearing long sleeves, pants, a wide-brimmed hat, and sunglasses whenever you are outside. Heart disease, diabetes, and high blood pressure  High blood pressure causes heart disease and increases the risk of stroke. High blood pressure is more likely to develop  in:  People who have blood pressure in the high end of the normal range (130-139/85-89 mm Hg).  People who are overweight or obese.  People who are African American.  If you are 72-12 years of age, have your blood pressure checked every 3-5 years. If you are 24 years of age or older, have your blood pressure checked every year. You should have your blood pressure measured twice-once when you are at a hospital or clinic, and once when you are not at a hospital or clinic. Record the average of the two measurements. To check your blood pressure when you are not at a hospital or clinic, you can use:  An automated blood pressure machine at a pharmacy.  A home blood pressure monitor.  If you are between 52 years and 79 years old, ask your health care provider if you should take aspirin to prevent strokes.  Have regular diabetes screenings. This involves taking a blood sample to check your fasting blood sugar  level.  If you are at a normal weight and have a low risk for diabetes, have this test once every three years after 53 years of age.  If you are overweight and have a high risk for diabetes, consider being tested at a younger age or more often. Preventing infection Hepatitis B  If you have a higher risk for hepatitis B, you should be screened for this virus. You are considered at high risk for hepatitis B if:  You were born in a country where hepatitis B is common. Ask your health care provider which countries are considered high risk.  Your parents were born in a high-risk country, and you have not been immunized against hepatitis B (hepatitis B vaccine).  You have HIV or AIDS.  You use needles to inject street drugs.  You live with someone who has hepatitis B.  You have had sex with someone who has hepatitis B.  You get hemodialysis treatment.  You take certain medicines for conditions, including cancer, organ transplantation, and autoimmune conditions. Hepatitis C  Blood testing is recommended for:  Everyone born from [birth year] through 1963.  Anyone with known risk factors for hepatitis C. Sexually transmitted infections (STIs)  You should be screened for sexually transmitted infections (STIs) including gonorrhea and chlamydia if:  You are sexually active and are younger than 53 years of age.  You are older than 54 years of age and your health care provider tells you that you are at risk for this type of infection.  Your sexual activity has changed since you were last screened and you are at an increased risk for chlamydia or gonorrhea. Ask your health care provider if you are at risk.  If you do not have HIV, but are at risk, it may be recommended that you take a prescription medicine daily to prevent HIV infection. This is called pre-exposure prophylaxis (PrEP). You are considered at risk if:  You are sexually active and do not regularly use condoms or know the HIV status  of your partner(s).  You take drugs by injection.  You are sexually active with a partner who has HIV. Talk with your health care provider about whether you are at high risk of being infected with HIV. If you choose to begin PrEP, you should first be tested for HIV. You should then be tested every 3 months for as long as you are taking PrEP. Pregnancy  If you are premenopausal and you may become pregnant, ask your health care  provider about preconception counseling.  If you may become pregnant, take 400 to 800 micrograms (mcg) of folic acid every day.  If you want to prevent pregnancy, talk to your health care provider about birth control (contraception). Osteoporosis and menopause  Osteoporosis is a disease in which the bones lose minerals and strength with aging. This can result in serious bone fractures. Your risk for osteoporosis can be identified using a bone density scan.  If you are 34 years of age or older, or if you are at risk for osteoporosis and fractures, ask your health care provider if you should be screened.  Ask your health care provider whether you should take a calcium or vitamin D supplement to lower your risk for osteoporosis.  Menopause may have certain physical symptoms and risks.  Hormone replacement therapy may reduce some of these symptoms and risks. Talk to your health care provider about whether hormone replacement therapy is right for you. Follow these instructions at home:  Schedule regular health, dental, and eye exams.  Stay current with your immunizations.  Do not use any tobacco products including cigarettes, chewing tobacco, or electronic cigarettes.  If you are pregnant, do not drink alcohol.  If you are breastfeeding, limit how much and how often you drink alcohol.  Limit alcohol intake to no more than 1 drink per day for nonpregnant women. One drink equals 12 ounces of beer, 5 ounces of wine, or 1 ounces of hard liquor.  Do not use street  drugs.  Do not share needles.  Ask your health care provider for help if you need support or information about quitting drugs.  Tell your health care provider if you often feel depressed.  Tell your health care provider if you have ever been abused or do not feel safe at home. This information is not intended to replace advice given to you by your health care provider. Make sure you discuss any questions you have with your health care provider. Document Released: 12/14/2010 Document Revised: 11/06/2015 Document Reviewed: 03/04/2015  2017 [employer]

## 2016-06-10 NOTE — Progress Notes (Signed)
   Subjective:    Patient ID: Shelia Leon, female    DOB: 07/23/1960, 55 y.o.   MRN: [id number]  HPI Here for medicare wellness and physical, no new complaints. Please see A/P for status and treatment of chronic medical problems.   Diet: DM since diabetic Physical activity: sedentary Depression/mood screen: negative Hearing: intact to whispered voice Visual acuity: grossly normal, not up to date on annual eye exam  ADLs: capable Fall risk: none Home safety: good Cognitive evaluation: intact to orientation, naming, recall and repetition EOL planning: adv directives discussed  I have personally reviewed and have noted 1. The patient's medical and social history - reviewed today no changes 2. Their use of alcohol, tobacco or illicit drugs 3. Their current medications and supplements 4. The patient's functional ability including ADL's, fall risks, home safety risks and hearing or visual impairment. 5. Diet and physical activities 6. Evidence for depression or mood disorders 7. Care team reviewed and updated (available in snapshot)  Review of Systems  Constitutional: Negative.   HENT: Negative.   Eyes: Negative.   Respiratory: Negative for cough, chest tightness and shortness of breath.   Cardiovascular: Negative for chest pain, palpitations and leg swelling.  Gastrointestinal: Negative for abdominal distention, abdominal pain, constipation, diarrhea, nausea and vomiting.  Musculoskeletal: Positive for arthralgias and myalgias. Negative for back pain, gait problem and joint swelling.  Skin: Negative.   Neurological: Positive for numbness. Negative for dizziness, weakness, light-headedness and headaches.  Psychiatric/Behavioral: Negative.       Objective:   Physical Exam  Constitutional: She is oriented to person, place, and time. She appears well-developed and well-nourished.  HENT:  Head: Normocephalic and atraumatic.  Eyes: EOM are normal.  Neck: Normal range of motion.    Cardiovascular: Normal rate and regular rhythm.   Pulmonary/Chest: Effort normal and breath sounds normal. No respiratory distress. She has no wheezes. She has no rales.  Abdominal: Soft. Bowel sounds are normal. She exhibits no distension. There is no tenderness. There is no rebound.  Musculoskeletal: She exhibits no edema.  Neurological: She is alert and oriented to person, place, and time. Coordination normal.  Skin: Skin is warm and dry.  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   Psychiatric: She has a normal mood and affect.   Vitals:   06/10/16 0832  BP: 110/70  Pulse: 73  Resp: 12  Temp: 98.3 F (36.8 C)  TempSrc: Oral  SpO2: 98%  Weight: 152 lb (68.9 kg)  Height: 5' 5.5" (1.664 m)      Assessment & Plan:

## 2016-06-10 NOTE — Assessment & Plan Note (Signed)
Taking synthroid 125 mcg daily. Checking TSH and adjust as needed.  

## 2016-06-10 NOTE — Assessment & Plan Note (Signed)
Taking oral pills. Checking B12 levels today.

## 2016-06-10 NOTE — Progress Notes (Signed)
Pre visit review using our clinic review tool, if applicable. No additional management support is needed unless otherwise documented below in the visit note. 

## 2016-06-10 NOTE — Assessment & Plan Note (Signed)
Neuropathy from prior to diabetes stable. Checking HgA1c, no eye exam done this year and reminded about the need to have this done. Taking metformin and glipizide and lisinopril and statin.

## 2016-07-12 ENCOUNTER — Telehealth: Payer: Self-pay | Admitting: *Deleted

## 2016-07-12 DIAGNOSIS — G629 Polyneuropathy, unspecified: Secondary | ICD-10-CM

## 2016-07-12 NOTE — Telephone Encounter (Signed)
Left msg on triage requesting refill on her hydrocodone.../lmb 

## 2016-07-13 MED ORDER — HYDROCODONE-ACETAMINOPHEN 5-325 MG PO TABS: 1.0000 | ORAL_TABLET | Freq: Every evening | ORAL | 0 refills | Status: DC | PRN

## 2016-07-13 NOTE — Telephone Encounter (Signed)
Called pt no answer LMOM rx ready for pick-up.../lmb 

## 2016-07-16 ENCOUNTER — Other Ambulatory Visit: Payer: Self-pay | Admitting: Internal Medicine

## 2016-07-16 ENCOUNTER — Other Ambulatory Visit: Payer: Self-pay | Admitting: Neurology

## 2016-08-05 ENCOUNTER — Other Ambulatory Visit: Payer: Self-pay | Admitting: Neurology

## 2016-08-08 ENCOUNTER — Other Ambulatory Visit: Payer: Self-pay | Admitting: Internal Medicine

## 2016-08-11 ENCOUNTER — Telehealth: Payer: Self-pay | Admitting: *Deleted

## 2016-08-11 DIAGNOSIS — G629 Polyneuropathy, unspecified: Secondary | ICD-10-CM

## 2016-08-11 NOTE — Telephone Encounter (Signed)
Rec'd call pt requesting refill on her Hydrocodone.../lmb 

## 2016-08-12 MED ORDER — HYDROCODONE-ACETAMINOPHEN 5-325 MG PO TABS: 1.0000 | ORAL_TABLET | Freq: Every evening | ORAL | 0 refills | Status: DC | PRN

## 2016-08-12 NOTE — Telephone Encounter (Signed)
Notified pt rx ready for pick-up.../lmb 

## 2016-09-03 ENCOUNTER — Ambulatory Visit (INDEPENDENT_AMBULATORY_CARE_PROVIDER_SITE_OTHER): Payer: Medicare Other | Admitting: Internal Medicine

## 2016-09-03 ENCOUNTER — Encounter: Payer: Self-pay | Admitting: Internal Medicine

## 2016-09-03 DIAGNOSIS — R252 Cramp and spasm: Secondary | ICD-10-CM | POA: Diagnosis not present

## 2016-09-03 NOTE — Patient Instructions (Signed)
We will have you try taking a magnesium pill daily over the counter to see if that helps with the cramps.

## 2016-09-03 NOTE — Progress Notes (Signed)
Pre visit review using our clinic review tool, if applicable. No additional management support is needed unless otherwise documented below in the visit note. 

## 2016-09-03 NOTE — Progress Notes (Signed)
   Subjective:    Patient ID: Shelia Leon, female    DOB: 11/10/1960, 56 y.o.   MRN: [id number]  HPI The patient is a 56 YO female coming in for worsening muscle cramps. She has struggled with these off and on. She has tried adding potassium to her diet with bananas and otc supplements. This has helped a little bit. In the last several weeks she has had increase in frequency and severity of the cramps. Worse in the evening time but sometimes they come during the day. She denies fevers or chills. No change in diet. Not drinking as much water. No change to medications.   Review of Systems  Constitutional: Positive for activity change. Negative for appetite change, chills, fatigue, fever and unexpected weight change.  Respiratory: Negative.   Cardiovascular: Negative.   Gastrointestinal: Negative.   Musculoskeletal: Positive for arthralgias and myalgias. Negative for back pain, gait problem, joint swelling, neck pain and neck stiffness.  Skin: Negative.   Neurological: Positive for weakness. Negative for dizziness, tremors, facial asymmetry, light-headedness, numbness and headaches.      Objective:   Physical Exam  Constitutional: She is oriented to person, place, and time. She appears well-developed and well-nourished.  HENT:  Head: Normocephalic and atraumatic.  Eyes: EOM are normal.  Neck: Normal range of motion.  Cardiovascular: Normal rate and regular rhythm.   Pulmonary/Chest: Effort normal. No respiratory distress. She has no wheezes. She has no rales.  Abdominal: Soft. She exhibits no distension. There is no tenderness.  Musculoskeletal: She exhibits no edema or tenderness.  Neurological: She is alert and oriented to person, place, and time. Coordination normal.  Skin: Skin is warm and dry.   Vitals:   09/03/16 0936  BP: 110/60  Pulse: 82  Resp: 16  Temp: 97.8 F (36.6 C)  TempSrc: Oral  SpO2: 99%  Weight: 154 lb (69.9 kg)  Height: 5' 5.5" (1.664 m)      Assessment &  Plan:

## 2016-09-03 NOTE — Assessment & Plan Note (Signed)
She is making sure to eat enough potassium. Advised to try magnesium pills daily to help with cramps. She is also advised to use her tizanidine and follow up with neurology.

## 2016-09-07 ENCOUNTER — Other Ambulatory Visit: Payer: Self-pay | Admitting: Neurology

## 2016-09-08 ENCOUNTER — Other Ambulatory Visit: Payer: Self-pay | Admitting: Neurology

## 2016-09-09 ENCOUNTER — Telehealth: Payer: Self-pay | Admitting: Internal Medicine

## 2016-09-09 DIAGNOSIS — G629 Polyneuropathy, unspecified: Secondary | ICD-10-CM

## 2016-09-09 MED ORDER — HYDROCODONE-ACETAMINOPHEN 5-325 MG PO TABS: 1.0000 | ORAL_TABLET | Freq: Every evening | ORAL | 0 refills | Status: DC | PRN

## 2016-09-09 NOTE — Telephone Encounter (Signed)
Pt called requesting refill of HYDROcodone-acetaminophen (NORCO/VICODIN) Please advise

## 2016-09-09 NOTE — Telephone Encounter (Signed)
Last filled on 08/12/2016

## 2016-09-09 NOTE — Telephone Encounter (Signed)
Printed and signed, please remind that typically these take 24-48 hours and she should not wait until the last day to fill these before due.

## 2016-09-09 NOTE — Telephone Encounter (Signed)
Patient contacted and aware of prescription waiting up front for her

## 2016-10-08 ENCOUNTER — Telehealth: Payer: Self-pay | Admitting: *Deleted

## 2016-10-08 DIAGNOSIS — G629 Polyneuropathy, unspecified: Secondary | ICD-10-CM

## 2016-10-08 NOTE — Telephone Encounter (Signed)
Rec'd call pt requesting refill on her Hydrocodone.../lmb 

## 2016-10-11 MED ORDER — HYDROCODONE-ACETAMINOPHEN 5-325 MG PO TABS: 1.0000 | ORAL_TABLET | Freq: Every evening | ORAL | 0 refills | Status: DC | PRN

## 2016-10-11 NOTE — Telephone Encounter (Signed)
Printed and signed. [place] narcotic database reviewed without red flags.

## 2016-10-11 NOTE — Telephone Encounter (Signed)
Called pt no answer LMOM rx ready for pick-up.../lmb 

## 2016-10-26 ENCOUNTER — Other Ambulatory Visit: Payer: Self-pay | Admitting: Internal Medicine

## 2016-11-09 ENCOUNTER — Telehealth: Payer: Self-pay | Admitting: *Deleted

## 2016-11-09 DIAGNOSIS — G629 Polyneuropathy, unspecified: Secondary | ICD-10-CM

## 2016-11-09 NOTE — Telephone Encounter (Signed)
Rec'd call pt requesting refill on her Hydrocodone.../lmb 

## 2016-11-10 MED ORDER — HYDROCODONE-ACETAMINOPHEN 5-325 MG PO TABS: 1.0000 | ORAL_TABLET | Freq: Every evening | ORAL | 0 refills | Status: DC | PRN

## 2016-11-10 NOTE — Telephone Encounter (Signed)
Notified pt rx ready for pick-up.../lmb 

## 2016-11-10 NOTE — Telephone Encounter (Signed)
Printed and signed.  

## 2016-12-07 ENCOUNTER — Telehealth: Payer: Self-pay | Admitting: *Deleted

## 2016-12-07 DIAGNOSIS — G629 Polyneuropathy, unspecified: Secondary | ICD-10-CM

## 2016-12-07 MED ORDER — HYDROCODONE-ACETAMINOPHEN 5-325 MG PO TABS: 1.0000 | ORAL_TABLET | Freq: Every evening | ORAL | 0 refills | Status: DC | PRN

## 2016-12-07 NOTE — Telephone Encounter (Signed)
[place] Controlled Substance Database reviewed with no irregularities. Medication dated and refilled for pick up.

## 2016-12-07 NOTE — Telephone Encounter (Signed)
Left msg on triage requesting refill on her hydrocodone.../lmb 

## 2016-12-08 ENCOUNTER — Ambulatory Visit (INDEPENDENT_AMBULATORY_CARE_PROVIDER_SITE_OTHER): Payer: Medicare Other | Admitting: Nurse Practitioner

## 2016-12-08 ENCOUNTER — Ambulatory Visit: Payer: Medicare Other | Admitting: Internal Medicine

## 2016-12-08 ENCOUNTER — Encounter: Payer: Self-pay | Admitting: Nurse Practitioner

## 2016-12-08 VITALS — BP 114/74 | HR 65 | Temp 97.6°F | Ht 65.5 in | Wt 154.0 lb

## 2016-12-08 DIAGNOSIS — G629 Polyneuropathy, unspecified: Secondary | ICD-10-CM | POA: Diagnosis not present

## 2016-12-08 DIAGNOSIS — E119 Type 2 diabetes mellitus without complications: Secondary | ICD-10-CM

## 2016-12-08 DIAGNOSIS — Z124 Encounter for screening for malignant neoplasm of cervix: Secondary | ICD-10-CM

## 2016-12-08 LAB — POCT GLYCOSYLATED HEMOGLOBIN (HGB A1C): Hemoglobin A1C: 8.7

## 2016-12-08 MED ORDER — SITAGLIPTIN PHOSPHATE 50 MG PO TABS: 50.0000 mg | ORAL_TABLET | Freq: Every day | ORAL | 3 refills | Status: DC

## 2016-12-08 NOTE — Assessment & Plan Note (Signed)
Glipizide discontinued due to midday hypoglycemic episodes. Start Januvia.

## 2016-12-08 NOTE — Progress Notes (Signed)
Subjective:  Patient ID: Deryl Ports, female    DOB: [birth year]  Age: 56 y.o. MRN: [id number]  CC: Follow-up (3 mo fu/still has muscle cramps/ she will make an appt with neurology and the joints doctor due to pain/ req refill on medications)   HPI  Muscle Cramps and Joint pain: Still to make appt with neurology and ortho. Water aerobics 2times a week/  Need referral to Gyn for pap smear.  DM: Hypoglycemic episodes 4times this month. Glucose reading in 60s post breakfast Highest glucose at 180s at bedtime. No GI symptoms. Current use of metformin and glypizide. Will schedule eye exam.  Outpatient Medications Prior to Visit  Medication Sig Dispense Refill  . Alcohol Swabs (ALCOHOL PREPS) PADS by Does not apply route.    Marland Kitchen atorvastatin (LIPITOR) 40 MG tablet TAKE 1 TABLET BY MOUTH AT  BEDTIME 90 tablet 1  . Blood Glucose Monitoring Suppl (ONE TOUCH ULTRA 2) w/Device KIT Use to check blood sugars  every day 1 each 0  . Cholecalciferol (VITAMIN D3) 2000 UNITS TABS Take 2,000 Units by mouth daily.    . cyanocobalamin 1000 MCG tablet Take 100 mcg by mouth daily.    Marland Kitchen docusate sodium (COLACE) 100 MG capsule Take 100 mg by mouth 2 (two) times daily.    Marland Kitchen glucose blood (ONE TOUCH ULTRA TEST) test strip 1 each by Other route 3 (three) times daily. Use to check blood sugars three times a day Dx E11.9 100 each 3  . glucose blood (ONE TOUCH ULTRA TEST) test strip 1 each by Other route 2 (two) times daily. Use to check blood sugars twice a day Dx e11.9    . HYDROcodone-acetaminophen (NORCO/VICODIN) 5-325 MG tablet Take 1-2 tablets by mouth at bedtime as needed for moderate pain. 60 tablet 0  . levothyroxine (SYNTHROID, LEVOTHROID) 125 MCG tablet TAKE 1 TABLET BY MOUTH  DAILY BEFORE BREAKFAST 90 tablet 1  . lisinopril (PRINIVIL,ZESTRIL) 5 MG tablet Take 1 tablet (5 mg total) by mouth daily. 90 tablet 3  . metFORMIN (GLUCOPHAGE) 1000 MG tablet TAKE 1 TABLET BY MOUTH TWO  TIMES DAILY WITH MEALS  180 tablet 1  . nortriptyline (PAMELOR) 10 MG capsule TAKE 5 CAPSULES BY MOUTH  EVERY NIGHT AT BEDTIME 270 capsule 0  . ONE TOUCH ULTRA TEST test strip USE TO CHECK BLOOD SUGARS 3 TIMES DAILY 300 each 2  . ONETOUCH DELICA LANCETS 86H MISC Use to check blood sugar 3  times daily 300 each 3  . tiZANidine (ZANAFLEX) 2 MG tablet TAKE 1 TO 2 TABLETS BY  MOUTH 3 TIMES DAILY AS  NEEDED FOR MUSCLE SPASM(S) 180 tablet 3  . glipiZIDE (GLIPIZIDE XL) 10 MG 24 hr tablet Take 1 tablet (10 mg total) by mouth daily with breakfast. 90 tablet 3  . naproxen (NAPROSYN) 500 MG tablet TAKE 1 TABLET BY MOUTH  DAILY WITH LUNCH (Patient not taking: Reported on 12/08/2016) 90 tablet 1   No facility-administered medications prior to visit.     ROS See HPI  Objective:  BP 114/74   Pulse 65   Temp 97.6 F (36.4 C)   Ht 5' 5.5" (1.664 m)   Wt 154 lb (69.9 kg)   LMP 01/15/2013   SpO2 99%   BMI 25.24 kg/m   BP Readings from Last 3 Encounters:  12/08/16 114/74  09/03/16 110/60  06/10/16 110/70    Wt Readings from Last 3 Encounters:  12/08/16 154 lb (69.9 kg)  09/03/16 154 lb (69.9 kg)  06/10/16 152 lb (68.9 kg)    Physical Exam  Constitutional: She is oriented to person, place, and time. No distress.  Cardiovascular: Normal rate and regular rhythm.   Pulmonary/Chest: Effort normal and breath sounds normal.  Musculoskeletal: She exhibits tenderness. She exhibits no edema.  Neurological: She is alert and oriented to person, place, and time.  Skin: Skin is warm and dry.  Vitals reviewed.   Lab Results  Component Value Date   WBC 11.7 (H) 06/10/2016   HGB 13.3 06/10/2016   HCT 37.7 06/10/2016   PLT 199.0 06/10/2016   GLUCOSE 149 (H) 06/10/2016   CHOL 151 06/10/2016   TRIG 100.0 06/10/2016   HDL 54.90 06/10/2016   LDLCALC 76 06/10/2016   ALT 29 06/10/2016   AST 20 06/10/2016   NA 139 06/10/2016   K 5.0 06/10/2016   CL 104 06/10/2016   CREATININE 0.85 06/10/2016   BUN 8 06/10/2016   CO2 30  06/10/2016   TSH 0.39 06/10/2016   HGBA1C 8.7% 12/08/2016    Mm Digital Screening Bilateral  Result Date: 01/29/2016 CLINICAL DATA:  Screening. EXAM: DIGITAL SCREENING BILATERAL MAMMOGRAM WITH CAD COMPARISON:  Previous exam(s). ACR Breast Density Category c: The breast tissue is heterogeneously dense, which may obscure small masses. FINDINGS: There are no findings suspicious for malignancy. Images were processed with CAD. IMPRESSION: No mammographic evidence of malignancy. A result letter of this screening mammogram will be mailed directly to the patient. RECOMMENDATION: Screening mammogram in one year. (Code:[id number]) BI-RADS CATEGORY  1: Negative. Electronically Signed   By: Lillia Mountain M.D.   On: 02/09/2016 15:57    Assessment & Plan:   Pennie was seen today for follow-up.  Diagnoses and all orders for this visit:  Type 2 diabetes mellitus without complication, without long-term current use of insulin (HCC) -     POCT HgB A1C -     Discontinue: sitaGLIPtin (JANUVIA) 50 MG tablet; Take 1 tablet (50 mg total) by mouth daily. -     sitaGLIPtin (JANUVIA) 50 MG tablet; Take 1 tablet (50 mg total) by mouth daily.  Neuropathy  Encounter for Papanicolaou smear for cervical cancer screening -     Ambulatory referral to Gynecology   I have discontinued Ms. Mckeithan's glipiZIDE and naproxen. I am also having her maintain her Alcohol Preps, Vitamin D3, cyanocobalamin, docusate sodium, glucose blood, ONE TOUCH ULTRA 2, glucose blood, ONETOUCH DELICA LANCETS 17B, tiZANidine, lisinopril, nortriptyline, ONE TOUCH ULTRA TEST, metFORMIN, atorvastatin, levothyroxine, HYDROcodone-acetaminophen, IBUPROFEN PO, and sitaGLIPtin.  Meds ordered this encounter  Medications  . IBUPROFEN PO    Sig: Take by mouth.  . DISCONTD: sitaGLIPtin (JANUVIA) 50 MG tablet    Sig: Take 1 tablet (50 mg total) by mouth daily.    Dispense:  30 tablet    Refill:  3    Order Specific Question:   Supervising Provider     Answer:   Cassandria Anger [[id number]]  . sitaGLIPtin (JANUVIA) 50 MG tablet    Sig: Take 1 tablet (50 mg total) by mouth daily.    Dispense:  30 tablet    Refill:  3    Order Specific Question:   Supervising Provider    Answer:   Cassandria Anger [[id number]]    Follow-up: Return in about 3 months (around 03/10/2017) for DM and HTN, hyperlipidemia with dr. Sharlet Salina.  Wilfred Lacy, NP

## 2016-12-08 NOTE — Telephone Encounter (Signed)
Notified pt rx ready for pick-up.../lmb 

## 2016-12-08 NOTE — Patient Instructions (Addendum)
Call ortho and neurology ASAP for joint pain and muscle cramps.  Schedule appt for opthalmology appt.  Diabetes Mellitus and Food It is important for you to manage your blood sugar (glucose) level. Your blood glucose level can be greatly affected by what you eat. Eating healthier foods in the appropriate amounts throughout the day at about the same time each day will help you control your blood glucose level. It can also help slow or prevent worsening of your diabetes mellitus. Healthy eating may even help you improve the level of your blood pressure and reach or maintain a healthy weight. General recommendations for healthful eating and cooking habits include:  Eating meals and snacks regularly. Avoid going long periods of time without eating to lose weight.  Eating a diet that consists mainly of plant-based foods, such as fruits, vegetables, nuts, legumes, and whole grains.  Using low-heat cooking methods, such as baking, instead of high-heat cooking methods, such as deep frying.  Work with your dietitian to make sure you understand how to use the Nutrition Facts information on food labels. How can food affect me? Carbohydrates Carbohydrates affect your blood glucose level more than any other type of food. Your dietitian will help you determine how many carbohydrates to eat at each meal and teach you how to count carbohydrates. Counting carbohydrates is important to keep your blood glucose at a healthy level, especially if you are using insulin or taking certain medicines for diabetes mellitus. Alcohol Alcohol can cause sudden decreases in blood glucose (hypoglycemia), especially if you use insulin or take certain medicines for diabetes mellitus. Hypoglycemia can be a life-threatening condition. Symptoms of hypoglycemia (sleepiness, dizziness, and disorientation) are similar to symptoms of having too much alcohol. If your health care provider has given you approval to drink alcohol, do so in  moderation and use the following guidelines:  Women should not have more than one drink per day, and men should not have more than two drinks per day. One drink is equal to: ? 12 oz of beer. ? 5 oz of wine. ? 1 oz of hard liquor.  Do not drink on an empty stomach.  Keep yourself hydrated. Have water, diet soda, or unsweetened iced tea.  Regular soda, juice, and other mixers might contain a lot of carbohydrates and should be counted.  What foods are not recommended? As you make food choices, it is important to remember that all foods are not the same. Some foods have fewer nutrients per serving than other foods, even though they might have the same number of calories or carbohydrates. It is difficult to get your body what it needs when you eat foods with fewer nutrients. Examples of foods that you should avoid that are high in calories and carbohydrates but low in nutrients include:  Trans fats (most processed foods list trans fats on the Nutrition Facts label).  Regular soda.  Juice.  Candy.  Sweets, such as cake, pie, doughnuts, and cookies.  Fried foods.  What foods can I eat? Eat nutrient-rich foods, which will nourish your body and keep you healthy. The food you should eat also will depend on several factors, including:  The calories you need.  The medicines you take.  Your weight.  Your blood glucose level.  Your blood pressure level.  Your cholesterol level.  You should eat a variety of foods, including:  Protein. ? Lean cuts of meat. ? Proteins low in saturated fats, such as fish, egg whites, and beans. Avoid processed meats.  Fruits and vegetables. ? Fruits and vegetables that may help control blood glucose levels, such as apples, mangoes, and yams.  Dairy products. ? Choose fat-free or low-fat dairy products, such as milk, yogurt, and cheese.  Grains, bread, pasta, and rice. ? Choose whole grain products, such as multigrain bread, whole oats, and  brown rice. These foods may help control blood pressure.  Fats. ? Foods containing healthful fats, such as nuts, avocado, olive oil, canola oil, and fish.  Does everyone with diabetes mellitus have the same meal plan? Because every person with diabetes mellitus is different, there is not one meal plan that works for everyone. It is very important that you meet with a dietitian who will help you create a meal plan that is just right for you. This information is not intended to replace advice given to you by your health care provider. Make sure you discuss any questions you have with your health care provider. Document Released: 02/25/2005 Document Revised: 11/06/2015 Document Reviewed: 04/27/2013 Elsevier Interactive Patient Education  2017 [employer]

## 2016-12-16 ENCOUNTER — Ambulatory Visit (INDEPENDENT_AMBULATORY_CARE_PROVIDER_SITE_OTHER): Payer: Medicare Other | Admitting: Obstetrics & Gynecology

## 2016-12-16 ENCOUNTER — Encounter: Payer: Self-pay | Admitting: Obstetrics & Gynecology

## 2016-12-16 VITALS — BP 128/86 | Ht 65.0 in | Wt 152.0 lb

## 2016-12-16 DIAGNOSIS — Z1151 Encounter for screening for human papillomavirus (HPV): Secondary | ICD-10-CM

## 2016-12-16 DIAGNOSIS — Z720 Tobacco use: Secondary | ICD-10-CM

## 2016-12-16 DIAGNOSIS — Z780 Asymptomatic menopausal state: Secondary | ICD-10-CM | POA: Diagnosis not present

## 2016-12-16 DIAGNOSIS — Z01411 Encounter for gynecological examination (general) (routine) with abnormal findings: Secondary | ICD-10-CM | POA: Diagnosis not present

## 2016-12-16 DIAGNOSIS — N841 Polyp of cervix uteri: Secondary | ICD-10-CM

## 2016-12-16 NOTE — Patient Instructions (Signed)
1. Encounter for gynecological examination with abnormal finding Normal Gyn exam except for Endocervical Polyp.  Pap/HPV HR done.  Breasts wnl.  Will schedule screening Mammo 01/2017.  2. Endocervical polyp Removed easily.  Specimen sent to Pathology.  3. Menopause present No HRT.  No PMB.  Recommend Vit. D supplement.  Ca++ in Nutrition.  Weight bearing physical activity. - DG Bone Density; Future  4. Tobacco abuse Strongly recommend to quit.  Weaning with a quit date discussed.  Shelia Leon, it was a pleasure to meet you today.  I will inform you of your results as soon as available.  Health Maintenance, Female Adopting a healthy lifestyle and getting preventive care can go a long way to promote health and wellness. Talk with your health care provider about what schedule of regular examinations is right for you. This is a good chance for you to check in with your provider about disease prevention and staying healthy. In between checkups, there are plenty of things you can do on your own. Experts have done a lot of research about which lifestyle changes and preventive measures are most likely to keep you healthy. Ask your health care provider for more information. Weight and diet Eat a healthy diet  Be sure to include plenty of vegetables, fruits, low-fat dairy products, and lean protein.  Do not eat a lot of foods high in solid fats, added sugars, or salt.  Get regular exercise. This is one of the most important things you can do for your health. ? Most adults should exercise for at least 150 minutes each week. The exercise should increase your heart rate and make you sweat (moderate-intensity exercise). ? Most adults should also do strengthening exercises at least twice a week. This is in addition to the moderate-intensity exercise.  Maintain a healthy weight  Body mass index (BMI) is a measurement that can be used to identify possible weight problems. It estimates body fat based on height  and weight. Your health care provider can help determine your BMI and help you achieve or maintain a healthy weight.  For females 78 years of age and older: ? A BMI below 18.5 is considered underweight. ? A BMI of 18.5 to 24.9 is normal. ? A BMI of 25 to 29.9 is considered overweight. ? A BMI of 30 and above is considered obese.  Watch levels of cholesterol and blood lipids  You should start having your blood tested for lipids and cholesterol at 54 years of age, then have this test every 5 years.  You may need to have your cholesterol levels checked more often if: ? Your lipid or cholesterol levels are high. ? You are older than 54 years of age. ? You are at high risk for heart disease.  Cancer screening Lung Cancer  Lung cancer screening is recommended for adults 30-58 years old who are at high risk for lung cancer because of a history of smoking.  A yearly low-dose CT scan of the lungs is recommended for people who: ? Currently smoke. ? Have quit within the past 15 years. ? Have at least a 30-pack-year history of smoking. A pack year is smoking an average of one pack of cigarettes a day for 1 year.  Yearly screening should continue until it has been 15 years since you quit.  Yearly screening should stop if you develop a health problem that would prevent you from having lung cancer treatment.  Breast Cancer  Practice breast self-awareness. This means understanding how your  breasts normally appear and feel.  It also means doing regular breast self-exams. Let your health care provider know about any changes, no matter how small.  If you are in your 20s or 30s, you should have a clinical breast exam (CBE) by a health care provider every 1-3 years as part of a regular health exam.  If you are 71 or older, have a CBE every year. Also consider having a breast X-ray (mammogram) every year.  If you have a family history of breast cancer, talk to your health care provider about  genetic screening.  If you are at high risk for breast cancer, talk to your health care provider about having an MRI and a mammogram every year.  Breast cancer gene (BRCA) assessment is recommended for women who have family members with BRCA-related cancers. BRCA-related cancers include: ? Breast. ? Ovarian. ? Tubal. ? Peritoneal cancers.  Results of the assessment will determine the need for genetic counseling and BRCA1 and BRCA2 testing.  Cervical Cancer Your health care provider may recommend that you be screened regularly for cancer of the pelvic organs (ovaries, uterus, and vagina). This screening involves a pelvic examination, including checking for microscopic changes to the surface of your cervix (Pap test). You may be encouraged to have this screening done every 3 years, beginning at age 31.  For women ages 56-65, health care providers may recommend pelvic exams and Pap testing every 3 years, or they may recommend the Pap and pelvic exam, combined with testing for human papilloma virus (HPV), every 5 years. Some types of HPV increase your risk of cervical cancer. Testing for HPV may also be done on women of any age with unclear Pap test results.  Other health care providers may not recommend any screening for nonpregnant women who are considered low risk for pelvic cancer and who do not have symptoms. Ask your health care provider if a screening pelvic exam is right for you.  If you have had past treatment for cervical cancer or a condition that could lead to cancer, you need Pap tests and screening for cancer for at least 20 years after your treatment. If Pap tests have been discontinued, your risk factors (such as having a new sexual partner) need to be reassessed to determine if screening should resume. Some women have medical problems that increase the chance of getting cervical cancer. In these cases, your health care provider may recommend more frequent screening and Pap  tests.  Colorectal Cancer  This type of cancer can be detected and often prevented.  Routine colorectal cancer screening usually begins at 54 years of age and continues through 54 years of age.  Your health care provider may recommend screening at an earlier age if you have risk factors for colon cancer.  Your health care provider may also recommend using home test kits to check for hidden blood in the stool.  A small camera at the end of a tube can be used to examine your colon directly (sigmoidoscopy or colonoscopy). This is done to check for the earliest forms of colorectal cancer.  Routine screening usually begins at age 4.  Direct examination of the colon should be repeated every 5-10 years through 54 years of age. However, you may need to be screened more often if early forms of precancerous polyps or small growths are found.  Skin Cancer  Check your skin from head to toe regularly.  Tell your health care provider about any new moles or changes in  moles, especially if there is a change in a mole's shape or color.  Also tell your health care provider if you have a mole that is larger than the size of a pencil eraser.  Always use sunscreen. Apply sunscreen liberally and repeatedly throughout the day.  Protect yourself by wearing long sleeves, pants, a wide-brimmed hat, and sunglasses whenever you are outside.  Heart disease, diabetes, and high blood pressure  High blood pressure causes heart disease and increases the risk of stroke. High blood pressure is more likely to develop in: ? People who have blood pressure in the high end of the normal range (130-139/85-89 mm Hg). ? People who are overweight or obese. ? People who are African American.  If you are 25-34 years of age, have your blood pressure checked every 3-5 years. If you are 64 years of age or older, have your blood pressure checked every year. You should have your blood pressure measured twice-once when you are at  a hospital or clinic, and once when you are not at a hospital or clinic. Record the average of the two measurements. To check your blood pressure when you are not at a hospital or clinic, you can use: ? An automated blood pressure machine at a pharmacy. ? A home blood pressure monitor.  If you are between 48 years and 27 years old, ask your health care provider if you should take aspirin to prevent strokes.  Have regular diabetes screenings. This involves taking a blood sample to check your fasting blood sugar level. ? If you are at a normal weight and have a low risk for diabetes, have this test once every three years after 54 years of age. ? If you are overweight and have a high risk for diabetes, consider being tested at a younger age or more often. Preventing infection Hepatitis B  If you have a higher risk for hepatitis B, you should be screened for this virus. You are considered at high risk for hepatitis B if: ? You were born in a country where hepatitis B is common. Ask your health care provider which countries are considered high risk. ? Your parents were born in a high-risk country, and you have not been immunized against hepatitis B (hepatitis B vaccine). ? You have HIV or AIDS. ? You use needles to inject street drugs. ? You live with someone who has hepatitis B. ? You have had sex with someone who has hepatitis B. ? You get hemodialysis treatment. ? You take certain medicines for conditions, including cancer, organ transplantation, and autoimmune conditions.  Hepatitis C  Blood testing is recommended for: ? Everyone born from [birth year] through 1963. ? Anyone with known risk factors for hepatitis C.  Sexually transmitted infections (STIs)  You should be screened for sexually transmitted infections (STIs) including gonorrhea and chlamydia if: ? You are sexually active and are younger than 54 years of age. ? You are older than 54 years of age and your health care provider tells  you that you are at risk for this type of infection. ? Your sexual activity has changed since you were last screened and you are at an increased risk for chlamydia or gonorrhea. Ask your health care provider if you are at risk.  If you do not have HIV, but are at risk, it may be recommended that you take a prescription medicine daily to prevent HIV infection. This is called pre-exposure prophylaxis (PrEP). You are considered at risk if: ? You are  sexually active and do not regularly use condoms or know the HIV status of your partner(s). ? You take drugs by injection. ? You are sexually active with a partner who has HIV.  Talk with your health care provider about whether you are at high risk of being infected with HIV. If you choose to begin PrEP, you should first be tested for HIV. You should then be tested every 3 months for as long as you are taking PrEP. Pregnancy  If you are premenopausal and you may become pregnant, ask your health care provider about preconception counseling.  If you may become pregnant, take 400 to 800 micrograms (mcg) of folic acid every day.  If you want to prevent pregnancy, talk to your health care provider about birth control (contraception). Osteoporosis and menopause  Osteoporosis is a disease in which the bones lose minerals and strength with aging. This can result in serious bone fractures. Your risk for osteoporosis can be identified using a bone density scan.  If you are 33 years of age or older, or if you are at risk for osteoporosis and fractures, ask your health care provider if you should be screened.  Ask your health care provider whether you should take a calcium or vitamin D supplement to lower your risk for osteoporosis.  Menopause may have certain physical symptoms and risks.  Hormone replacement therapy may reduce some of these symptoms and risks. Talk to your health care provider about whether hormone replacement therapy is right for  you. Follow these instructions at home:  Schedule regular health, dental, and eye exams.  Stay current with your immunizations.  Do not use any tobacco products including cigarettes, chewing tobacco, or electronic cigarettes.  If you are pregnant, do not drink alcohol.  If you are breastfeeding, limit how much and how often you drink alcohol.  Limit alcohol intake to no more than 1 drink per day for nonpregnant women. One drink equals 12 ounces of beer, 5 ounces of wine, or 1 ounces of hard liquor.  Do not use street drugs.  Do not share needles.  Ask your health care provider for help if you need support or information about quitting drugs.  Tell your health care provider if you often feel depressed.  Tell your health care provider if you have ever been abused or do not feel safe at home. This information is not intended to replace advice given to you by your health care provider. Make sure you discuss any questions you have with your health care provider. Document Released: 12/14/2010 Document Revised: 11/06/2015 Document Reviewed: 03/04/2015 Elsevier Interactive Patient Education  [employer].

## 2016-12-16 NOTE — Progress Notes (Signed)
Shelia Leon 1960/09/04 [id number]   History:    56 y.o. G2P2 Married.  Has 2 daughters in [place].  Has grand-children.  RP:  New patient presenting for annual gyn exam  HPI:  Menopause x age 7.  No HRT.  No PMB.  No pelvic pain.  Sexually active.  No dyspareunia.  Breasts wnl.  Mictions/BMs wnl.  Past medical history,surgical history, family history and social history were all reviewed and documented in the EPIC chart.  Gynecologic History Patient's last menstrual period was 01/15/2013. Contraception: post menopausal status Last Pap: 3.5 yrs ago. Results were: normal Last mammogram: 01/2016. Results were: Negative  Obstetric History OB History  Gravida Para Term Preterm AB Living  2 2       2   SAB TAB Ectopic Multiple Live Births               # Outcome Date GA Lbr Len/2nd Weight Sex Delivery Anes PTL Lv  2 Para           1 Para                ROS: A ROS was performed and pertinent positives and negatives are included in the history.  GENERAL: No fevers or chills. HEENT: No change in vision, no earache, sore throat or sinus congestion. NECK: No pain or stiffness. CARDIOVASCULAR: No chest pain or pressure. No palpitations. PULMONARY: No shortness of breath, cough or wheeze. GASTROINTESTINAL: No abdominal pain, nausea, vomiting or diarrhea, melena or bright red blood per rectum. GENITOURINARY: No urinary frequency, urgency, hesitancy or dysuria. MUSCULOSKELETAL: No joint or muscle pain, no back pain, no recent trauma. DERMATOLOGIC: No rash, no itching, no lesions. ENDOCRINE: No polyuria, polydipsia, no heat or cold intolerance. No recent change in weight. HEMATOLOGICAL: No anemia or easy bruising or bleeding. NEUROLOGIC: No headache, seizures, numbness, tingling or weakness. PSYCHIATRIC: No depression, no loss of interest in normal activity or change in sleep pattern.     Exam:   BP 128/86   Ht 5\' 5"  (1.651 m)   Wt 152 lb (68.9 kg)   LMP 01/15/2013   BMI 25.29 kg/m    Body mass index is 25.29 kg/m.  General appearance : Well developed well nourished female. No acute distress HEENT: Eyes: no retinal hemorrhage or exudates,  Neck supple, trachea midline, no carotid bruits, no thyroidmegaly Lungs: Clear to auscultation, no rhonchi or wheezes, or rib retractions  Heart: Regular rate and rhythm, no murmurs or gallops Breast:Examined in sitting and supine position were symmetrical in appearance, no palpable masses or tenderness,  no skin retraction, no nipple inversion, no nipple discharge, no skin discoloration, no axillary or supraclavicular lymphadenopathy Abdomen: no palpable masses or tenderness, no rebound or guarding Extremities: no edema or skin discoloration or tenderness  Pelvic:  Bartholin, Urethra, Skene Glands: Within normal limits             Vagina: No gross lesions or discharge  Cervix: Small endocervical polyp.  Pap/HPV done.  Them polyp removed easily with a Boseman clamp after verbal consent.  Silver Nitrate applied.  Good hemostasis.    Uterus  AV, normal size, shape and consistency, non-tender and mobile  Adnexa  Without masses or tenderness  Anus and perineum  normal      Assessment/Plan:  56 y.o. female for annual exam   1. Encounter for gynecological examination with abnormal finding Normal Gyn exam except for Endocervical Polyp.  Pap/HPV HR done.  Breasts wnl.  Will schedule  screening Mammo 01/2017.  2. Endocervical polyp Removed easily.  Specimen sent to Pathology.  3. Menopause present No HRT.  No PMB.  Recommend Vit. D supplement.  Ca++ in Nutrition.  Weight bearing physical activity. - DG Bone Density; Future  4. Tobacco abuse Strongly recommend to quit.  Weaning with a quit date discussed.  Counseling on above issued >50% x 20 minutes.  Genia DelMarie-Lyne Tekia Waterbury MD, 9:19 AM 12/16/2016

## 2016-12-16 NOTE — Addendum Note (Signed)
Addended by: Berna SpareASTILLO, BLANCA A on: 12/16/2016 10:02 AM   Modules accepted: Orders

## 2016-12-20 LAB — PATHOLOGY

## 2016-12-22 LAB — PAP, TP IMAGING W/ HPV RNA, RFLX HPV TYPE 16,18/45: HPV MRNA, HIGH RISK: NOT DETECTED

## 2017-01-10 ENCOUNTER — Other Ambulatory Visit: Payer: Self-pay | Admitting: Family

## 2017-01-10 ENCOUNTER — Telehealth: Payer: Self-pay | Admitting: Internal Medicine

## 2017-01-10 DIAGNOSIS — G629 Polyneuropathy, unspecified: Secondary | ICD-10-CM

## 2017-01-10 MED ORDER — HYDROCODONE-ACETAMINOPHEN 5-325 MG PO TABS: 1.0000 | ORAL_TABLET | Freq: Every evening | ORAL | 0 refills | Status: DC | PRN

## 2017-01-10 NOTE — Telephone Encounter (Signed)
[facility] reviewed with no irregularities. Requires UDS at pick up please.

## 2017-01-10 NOTE — Telephone Encounter (Signed)
Patient requesting script for hydrocodone.

## 2017-01-10 NOTE — Telephone Encounter (Signed)
Called pt no answer LMOM rx ready for pick-up.../lmb 

## 2017-01-10 NOTE — Telephone Encounter (Signed)
Check [place] registry last filled 12/09/2016 @ [facility]. Pls advise in MD absence...[name]/lmb

## 2017-01-12 ENCOUNTER — Other Ambulatory Visit: Payer: Self-pay | Admitting: Gynecology

## 2017-01-12 ENCOUNTER — Encounter: Payer: Self-pay | Admitting: Family

## 2017-01-12 DIAGNOSIS — Z780 Asymptomatic menopausal state: Secondary | ICD-10-CM

## 2017-01-12 DIAGNOSIS — Z13820 Encounter for screening for osteoporosis: Secondary | ICD-10-CM

## 2017-01-17 ENCOUNTER — Telehealth: Payer: Self-pay

## 2017-01-17 NOTE — Telephone Encounter (Signed)
Received UDS results back for pt that was done on 01/12/17. UDS came back negative for hydrocodone. Pt just had rx refilled on 01/12/17 per [facility]. Just wanted to make you aware.

## 2017-01-17 NOTE — Telephone Encounter (Signed)
Recommend pill count 1 week before next refill due during the week of August 27th.

## 2017-01-25 ENCOUNTER — Other Ambulatory Visit: Payer: Self-pay | Admitting: Internal Medicine

## 2017-01-31 ENCOUNTER — Other Ambulatory Visit: Payer: Self-pay | Admitting: Internal Medicine

## 2017-01-31 ENCOUNTER — Other Ambulatory Visit: Payer: Self-pay | Admitting: Neurology

## 2017-02-09 ENCOUNTER — Telehealth: Payer: Self-pay | Admitting: Internal Medicine

## 2017-02-09 ENCOUNTER — Other Ambulatory Visit: Payer: Self-pay | Admitting: Family

## 2017-02-09 DIAGNOSIS — G629 Polyneuropathy, unspecified: Secondary | ICD-10-CM

## 2017-02-09 MED ORDER — HYDROCODONE-ACETAMINOPHEN 5-325 MG PO TABS: 1.0000 | ORAL_TABLET | Freq: Every evening | ORAL | 0 refills | Status: DC | PRN

## 2017-02-09 NOTE — Telephone Encounter (Signed)
Pt called requesting a refill on HYDROcodone-acetaminophen (NORCO/VICODIN) 5-325 MG tablet. °

## 2017-02-09 NOTE — Telephone Encounter (Signed)
Called pt no answer LMOM rx ready for pick-up.../lmb 

## 2017-02-09 NOTE — Telephone Encounter (Signed)
Needs UDS at pick up. Medication printed and dated per previous refill.

## 2017-02-09 NOTE — Telephone Encounter (Signed)
Check [place] registry last filled 01/12/2017...Shelia Leon

## 2017-02-12 DIAGNOSIS — M8580 Other specified disorders of bone density and structure, unspecified site: Secondary | ICD-10-CM

## 2017-02-12 HISTORY — DX: Other specified disorders of bone density and structure, unspecified site: M85.80

## 2017-03-08 ENCOUNTER — Ambulatory Visit (INDEPENDENT_AMBULATORY_CARE_PROVIDER_SITE_OTHER): Payer: Medicare Other

## 2017-03-08 ENCOUNTER — Encounter: Payer: Self-pay | Admitting: Gynecology

## 2017-03-08 ENCOUNTER — Telehealth: Payer: Self-pay | Admitting: Gynecology

## 2017-03-08 ENCOUNTER — Other Ambulatory Visit: Payer: Self-pay | Admitting: Gynecology

## 2017-03-08 DIAGNOSIS — Z780 Asymptomatic menopausal state: Secondary | ICD-10-CM

## 2017-03-08 DIAGNOSIS — Z1329 Encounter for screening for other suspected endocrine disorder: Secondary | ICD-10-CM

## 2017-03-08 DIAGNOSIS — Z1321 Encounter for screening for nutritional disorder: Secondary | ICD-10-CM

## 2017-03-08 DIAGNOSIS — M898X9 Other specified disorders of bone, unspecified site: Secondary | ICD-10-CM

## 2017-03-08 DIAGNOSIS — Z13820 Encounter for screening for osteoporosis: Secondary | ICD-10-CM | POA: Diagnosis not present

## 2017-03-08 DIAGNOSIS — M8589 Other specified disorders of bone density and structure, multiple sites: Secondary | ICD-10-CM

## 2017-03-08 DIAGNOSIS — Z01419 Encounter for gynecological examination (general) (routine) without abnormal findings: Secondary | ICD-10-CM

## 2017-03-08 DIAGNOSIS — M8580 Other specified disorders of bone density and structure, unspecified site: Secondary | ICD-10-CM

## 2017-03-08 NOTE — Telephone Encounter (Signed)
Tell patient her most recent bone density is approaching osteoporosis. Is not quite there but close. I would recommend baseline labs to include comprehensive metabolic panel, TSH, PTH and vitamin D level. Weightbearing exercise on a regular basis such as walking will help prevent progression. Also adequate calcium intake at 1500 mg total dietary calcium daily. We will see what her vitamin D level comes back before making vitamin D supplement recommendations. Otherwise I would recommend repeating the bone density in 2 years.

## 2017-03-09 ENCOUNTER — Encounter: Payer: Self-pay | Admitting: *Deleted

## 2017-03-09 NOTE — Addendum Note (Signed)
Addended by: Aura Camps on: 03/09/2017 03:56 PM   Modules accepted: Orders

## 2017-03-09 NOTE — Telephone Encounter (Signed)
Pt informed. Labs placed, coming on 03/11/17 @ 2:30pm

## 2017-03-09 NOTE — Telephone Encounter (Signed)
Pt called back and had to leave another message, I also sent my chart message

## 2017-03-09 NOTE — Telephone Encounter (Signed)
Left message for pt to call.

## 2017-03-10 ENCOUNTER — Encounter: Payer: Self-pay | Admitting: Internal Medicine

## 2017-03-10 ENCOUNTER — Ambulatory Visit (INDEPENDENT_AMBULATORY_CARE_PROVIDER_SITE_OTHER): Payer: Medicare Other | Admitting: Internal Medicine

## 2017-03-10 ENCOUNTER — Other Ambulatory Visit (INDEPENDENT_AMBULATORY_CARE_PROVIDER_SITE_OTHER): Payer: Medicare Other

## 2017-03-10 VITALS — BP 110/70 | HR 62 | Temp 98.0°F | Ht 65.0 in | Wt 145.0 lb

## 2017-03-10 DIAGNOSIS — Z23 Encounter for immunization: Secondary | ICD-10-CM

## 2017-03-10 DIAGNOSIS — E119 Type 2 diabetes mellitus without complications: Secondary | ICD-10-CM

## 2017-03-10 DIAGNOSIS — F1190 Opioid use, unspecified, uncomplicated: Secondary | ICD-10-CM | POA: Diagnosis not present

## 2017-03-10 DIAGNOSIS — E538 Deficiency of other specified B group vitamins: Secondary | ICD-10-CM | POA: Diagnosis not present

## 2017-03-10 DIAGNOSIS — R252 Cramp and spasm: Secondary | ICD-10-CM

## 2017-03-10 LAB — CBC
HEMATOCRIT: 40.9 % (ref 36.0–46.0)
Hemoglobin: 14 g/dL (ref 12.0–15.0)
MCHC: 34.2 g/dL (ref 30.0–36.0)
MCV: 93 fl (ref 78.0–100.0)
PLATELETS: 194 10*3/uL (ref 150.0–400.0)
RBC: 4.4 Mil/uL (ref 3.87–5.11)
RDW: 12.9 % (ref 11.5–15.5)
WBC: 10.5 10*3/uL (ref 4.0–10.5)

## 2017-03-10 LAB — COMPREHENSIVE METABOLIC PANEL
ALK PHOS: 77 U/L (ref 39–117)
ALT: 24 U/L (ref 0–35)
AST: 24 U/L (ref 0–37)
Albumin: 4.6 g/dL (ref 3.5–5.2)
BILIRUBIN TOTAL: 0.5 mg/dL (ref 0.2–1.2)
BUN: 10 mg/dL (ref 6–23)
CALCIUM: 9.6 mg/dL (ref 8.4–10.5)
CO2: 26 mEq/L (ref 19–32)
Chloride: 104 mEq/L (ref 96–112)
Creatinine, Ser: 0.82 mg/dL (ref 0.40–1.20)
GFR: 77.25 mL/min (ref >60.00)
Glucose, Bld: 121 mg/dL — ABNORMAL HIGH (ref 70–99)
Potassium: 4.2 mEq/L (ref 3.5–5.1)
Sodium: 137 mEq/L (ref 135–145)
TOTAL PROTEIN: 7.3 g/dL (ref 6.0–8.3)

## 2017-03-10 LAB — LIPID PANEL
Cholesterol: 118 mg/dL (ref 0–200)
HDL: 42.6 mg/dL (ref >39.00)
LDL Cholesterol: 54 mg/dL (ref 0–99)
NonHDL: 75.52
TRIGLYCERIDES: 108 mg/dL (ref 0.0–149.0)
Total CHOL/HDL Ratio: 3
VLDL: 21.6 mg/dL (ref 0.0–40.0)

## 2017-03-10 LAB — HEMOGLOBIN A1C: HEMOGLOBIN A1C: 11.2 % — AB (ref 4.6–6.5)

## 2017-03-10 LAB — TSH: TSH: 0.66 u[IU]/mL (ref 0.35–4.50)

## 2017-03-10 LAB — VITAMIN D 25 HYDROXY (VIT D DEFICIENCY, FRACTURES): VITD: 55.93 ng/mL (ref 30.00–100.00)

## 2017-03-10 LAB — VITAMIN B12: Vitamin B-12: 916 pg/mL — ABNORMAL HIGH (ref 211–911)

## 2017-03-10 LAB — FERRITIN: Ferritin: 7.2 ng/mL — ABNORMAL LOW (ref 10.0–291.0)

## 2017-03-10 LAB — T4, FREE: Free T4: 1.13 ng/dL (ref 0.60–1.60)

## 2017-03-10 MED ORDER — DICYCLOMINE HCL 10 MG PO CAPS: 10.0000 mg | ORAL_CAPSULE | Freq: Three times a day (TID) | ORAL | 3 refills | Status: DC

## 2017-03-10 NOTE — Progress Notes (Signed)
   Subjective:    Patient ID: Shelia Leon, female    DOB: [birth year], 56 y.o.   MRN: [id number]  HPI The patient is a 56 YO female coming in for muscle cramps. She has struggled with these for some time. She has been taking hydrocodone in the past up to twice a day for this. She denies taking this more than she should. Takes at least once per day. She has had several UDS screens recently which are not appropriate. She reports taking this last night. She denies that the tizanidine is helping. The cramps are now more in her stomach and are 15/10 pain. They keep her from eating much and keeps her from doing normal activities. Worse in the last 2 months or so.   Review of Systems  Constitutional: Positive for activity change and appetite change.  HENT: Negative.   Eyes: Negative.   Respiratory: Negative for cough, chest tightness and shortness of breath.   Cardiovascular: Negative for chest pain, palpitations and leg swelling.  Gastrointestinal: Positive for abdominal pain. Negative for abdominal distention, constipation, diarrhea, nausea and vomiting.  Musculoskeletal: Positive for arthralgias, back pain, myalgias and neck pain.  Skin: Negative.   Neurological: Negative.   Psychiatric/Behavioral: Negative.       Objective:   Physical Exam  Constitutional: She is oriented to person, place, and time. She appears well-developed and well-nourished.  HENT:  Head: Normocephalic and atraumatic.  Eyes: EOM are normal.  Neck: Normal range of motion.  Cardiovascular: Normal rate and regular rhythm.   Pulmonary/Chest: Effort normal and breath sounds normal. No respiratory distress. She has no wheezes. She has no rales.  Abdominal: Soft. Bowel sounds are normal. She exhibits no distension. There is tenderness. There is no rebound.  Mild tenderness stomach, no rebound or guarding. BS normal.   Musculoskeletal: She exhibits no edema.  Neurological: She is alert and oriented to person, place, and time.  Coordination normal.  Skin: Skin is warm and dry.  Psychiatric: She has a normal mood and affect.   Vitals:   03/10/17 0920  BP: 110/70  Pulse: 62  Temp: 98 F (36.7 C)  TempSrc: Oral  SpO2: 100%  Weight: 145 lb (65.8 kg)  Height:  (1.651 m)      Assessment & Plan:  Flu shot given at visit.

## 2017-03-10 NOTE — Patient Instructions (Signed)
We have sent in bentyl to replace the tizanidine. You can take 1 pill up to 4 times per day.   We are checking the labs today and the urine to check for the hydrocodone. If there is no hydrocodone we cannot prescribe this for you anymore due to the laws.

## 2017-03-11 ENCOUNTER — Other Ambulatory Visit: Payer: Medicare Other

## 2017-03-11 DIAGNOSIS — F1190 Opioid use, unspecified, uncomplicated: Secondary | ICD-10-CM | POA: Insufficient documentation

## 2017-03-11 NOTE — Assessment & Plan Note (Signed)
Needs repeat HgA1c and checking today. Taking metformin and januvia and adjust as needed.

## 2017-03-11 NOTE — Assessment & Plan Note (Signed)
Checking B12 level to make sure she does not have low levels. Injections currently for replacement.

## 2017-03-11 NOTE — Assessment & Plan Note (Signed)
Rx for bentyl instead of tizanidine for the stomach muscle cramps. Checking labs for cause since none in the last year. Adjust as needed.

## 2017-03-11 NOTE — Assessment & Plan Note (Signed)
2 negative UDS screen recently (only 1 documented in computer in telephone note, will try to track down the other report to scan into the computer). Last filled 02/11/17 and reviewed [place] narcotic database without red flags or alternative fills. We have let her know that if she is not found to have any hydrocodone we can no longer prescribe her narcotics.

## 2017-03-16 LAB — PAIN MGMT, PROFILE 8 W/CONF, U
6 ACETYLMORPHINE: NEGATIVE ng/mL (ref <10)
AMPHETAMINES: NEGATIVE ng/mL (ref <500)
Alcohol Metabolites: NEGATIVE ng/mL (ref <500)
BENZODIAZEPINES: NEGATIVE ng/mL (ref <100)
BUPRENORPHINE, URINE: NEGATIVE ng/mL (ref <5)
CODEINE: NEGATIVE ng/mL (ref <50)
Cocaine Metabolite: NEGATIVE ng/mL (ref <150)
Creatinine: 130.3 mg/dL
HYDROCODONE: 272 ng/mL — AB (ref <50)
MDMA: NEGATIVE ng/mL (ref <500)
MORPHINE: NEGATIVE ng/mL (ref <50)
Marijuana Metabolite: NEGATIVE ng/mL (ref <20)
Norhydrocodone: 618 ng/mL — ABNORMAL HIGH (ref <50)
OXYCODONE: NEGATIVE ng/mL (ref <100)
Opiates: POSITIVE ng/mL — AB (ref <100)
Oxidant: NEGATIVE ug/mL (ref <200)
pH: 5.92 (ref 4.5–9.0)

## 2017-03-17 ENCOUNTER — Telehealth: Payer: Self-pay | Admitting: Internal Medicine

## 2017-03-17 ENCOUNTER — Other Ambulatory Visit: Payer: Self-pay | Admitting: Internal Medicine

## 2017-03-17 DIAGNOSIS — E1165 Type 2 diabetes mellitus with hyperglycemia: Principal | ICD-10-CM

## 2017-03-17 DIAGNOSIS — IMO0002 Reserved for concepts with insufficient information to code with codable children: Secondary | ICD-10-CM

## 2017-03-17 DIAGNOSIS — E1149 Type 2 diabetes mellitus with other diabetic neurological complication: Secondary | ICD-10-CM

## 2017-03-17 NOTE — Telephone Encounter (Signed)
FYI... This is Dr. [name] pt and she is aware that she would not refill her pain med., and doubt Tammy Sours will approve it either...[name]

## 2017-03-17 NOTE — Telephone Encounter (Signed)
We did talk with her about this at visit that we would likely not be able to do this for her since she has had 2 negative drug screens recently.

## 2017-03-17 NOTE — Telephone Encounter (Signed)
Pt called in and said that she needed a refil on her HYDROcodone-acetaminophen (NORCO/VICODIN) 5-325 MG tablet [[id number]]    She is aware that greg will not be back til Monday

## 2017-03-17 NOTE — Telephone Encounter (Signed)
Called pt and told her the below response, she expressed understanding and said ok

## 2017-04-09 ENCOUNTER — Other Ambulatory Visit: Payer: Self-pay | Admitting: Neurology

## 2017-04-09 ENCOUNTER — Other Ambulatory Visit: Payer: Self-pay | Admitting: Internal Medicine

## 2017-04-11 ENCOUNTER — Other Ambulatory Visit: Payer: Self-pay | Admitting: *Deleted

## 2017-04-11 MED ORDER — NORTRIPTYLINE HCL 10 MG PO CAPS: 30.0000 mg | ORAL_CAPSULE | Freq: Every day | ORAL | 2 refills | Status: DC

## 2017-04-11 NOTE — Telephone Encounter (Signed)
Rx sent with 2 refills and a note stating that patient needs a follow up appointment before anymore refills are given.

## 2017-04-29 ENCOUNTER — Other Ambulatory Visit: Payer: Self-pay | Admitting: Internal Medicine

## 2017-05-22 ENCOUNTER — Other Ambulatory Visit: Payer: Self-pay | Admitting: Internal Medicine

## 2017-06-02 ENCOUNTER — Encounter: Payer: Self-pay | Admitting: Internal Medicine

## 2017-06-02 ENCOUNTER — Ambulatory Visit: Payer: Medicare Other | Admitting: Internal Medicine

## 2017-06-02 VITALS — BP 106/60 | HR 78 | Ht 65.75 in | Wt 146.8 lb

## 2017-06-02 DIAGNOSIS — E119 Type 2 diabetes mellitus without complications: Secondary | ICD-10-CM

## 2017-06-02 LAB — POCT GLYCOSYLATED HEMOGLOBIN (HGB A1C): HEMOGLOBIN A1C: 7.9

## 2017-06-02 NOTE — Progress Notes (Signed)
Patient ID: Shelia Leon, female   DOB: 02/17/1961, 56 y.o.   MRN: [id number]   HPI: Shelia Leon is a 56 y.o.-year-old female, referred by her PCP, Dr. Sharlet Salina, for management of DM2, dx in ~2000, non-insulin-dependent, uncontrolled, without long term complications.  At last check, HbA1c was very high >> changed diet (more veggies, less coffee, less cigarettes), also started walking. She lost 6 lbs since 12/2016.  Last hemoglobin A1c was: Lab Results  Component Value Date   HGBA1C 11.2 (H) 03/10/2017   HGBA1C 8.7% 12/08/2016   HGBA1C 7.2 (H) 12/09/2015   Pt is on a regimen of: - Metformin 1000 mg 2x a day, with meals She was also on Januvia 100 mg in am >> stopped this month b/c $. She was on Glipizide >> low CBGs.  Pt checks her sugars 2x a day and they are: - am: 125-130 - 2h after b'fast: n/c - before lunch: n/c - 2h after lunch: n/c - before dinner: n/c - 2h after dinner: 120-135 - bedtime: n/c - nighttime: n/c No lows. Lowest sugar was 62 (not since stopping Glipizide); she has hypoglycemia awareness at 70s..  Highest sugar was 300s.  Glucometer: OneTouch Ultra  Pt's meals are: - Breakfast: b'fast bar; coffee; wheat toast + cheerios + regular milk - Lunch: veggies + banana + water - Dinner: brown rice + occas. Steak or fish or chicken + veggies; tacos - Snacks: cup cakes - diabetic No sodas, diluted ice tea.  - No CKD, last BUN/creatinine:  Lab Results  Component Value Date   BUN 10 03/10/2017   BUN 8 06/10/2016   CREATININE 0.82 03/10/2017   CREATININE 0.85 06/10/2016  On Lisinopril 5. - + HL;  last set of lipids: Lab Results  Component Value Date   CHOL 118 03/10/2017   HDL 42.60 03/10/2017   LDLCALC 54 03/10/2017   TRIG 108.0 03/10/2017   CHOLHDL 3 03/10/2017  On Lipitor 40. - last eye exam was in 2017. No DR. + low grade catarcts. - + numbness and tingling in her feet - whole foot. Has cramps in legs. She takes a B12 vitamin.  Pt has FH of DM in  daughter, 3x brothers.  Is a history of hypothyroidism, on levothyroxine 125 mcg daily.  Latest TSH was normal: Lab Results  Component Value Date   TSH 0.66 03/10/2017    ROS: Constitutional: + wt loss, no fatigue, no subjective hyperthermia/hypothermia Eyes: no blurry vision, no xerophthalmia ENT: no sore throat, no nodules palpated in throat, no dysphagia/odynophagia, no hoarseness Cardiovascular: no CP/SOB/palpitations/leg swelling Respiratory: no cough/SOB Gastrointestinal: no N/V/D/C Musculoskeletal: no muscle/joint aches Skin: no rashes Neurological: no tremors/numbness/tingling/dizziness Psychiatric: no depression/anxiety  Past Medical History:  Diagnosis Date  . Anemia   . Arthritis   . Diabetes mellitus without complication ([place])   . Hyperlipidemia   . Osteopenia 02/2017   T score -2.4 FRAX 2.3%/0.2%  . Thyroid disease    Past Surgical History:  Procedure Laterality Date  . BUNIONECTOMY Right 1987  . CERVICAL SPINE SURGERY     hardware   Social History   Socioeconomic History  . Marital status: Married    Spouse name: Not on file  . Number of children: Not on file  . Years of education: Not on file  . Highest education level: Not on file  Social Needs  . Financial resource strain: Not on file  . Food insecurity - worry: Not on file  . Food insecurity - inability: Not on file  .  Transportation needs - medical: Not on file  . Transportation needs - non-medical: Not on file  Occupational History  . Not on file  Tobacco Use  . Smoking status: Current Every Day Smoker    Packs/day: 0.40    Years: 34.00    Pack years: 13.60    Types: Cigarettes  . Smokeless tobacco: Never Used  . Tobacco comment: 5-7 cigs a day   Substance and Sexual Activity  . Alcohol use: No  . Drug use: No  . Sexual activity: Yes    Birth control/protection: Post-menopausal    Comment: 1st intercourse- 18, partners - 6  Other Topics Concern  . Not on file  Social History  Narrative   Lives with husband in a 2 story house.  She had two grown children.   She previously worked as CNA, stopped working in 2011.  She has been on disability since then.      States that it is very painful going up and down stairs.    Sleeps upstairs so she only makes one trip a day up at bedtime.      High school education.   Current Outpatient Medications on File Prior to Visit  Medication Sig Dispense Refill  . Alcohol Swabs (ALCOHOL PREPS) PADS by Does not apply route.    . atorvastatin (LIPITOR) 40 MG tablet TAKE 1 TABLET BY MOUTH AT  BEDTIME 90 tablet 0  . Blood Glucose Monitoring Suppl (ONE TOUCH ULTRA 2) w/Device KIT USE TO CHECK BLOOD SUGARS  EVERY DAY 1 each 0  . Cholecalciferol (VITAMIN D3) 2000 UNITS TABS Take 2,000 Units by mouth daily.    . cyanocobalamin 1000 MCG tablet Take 100 mcg by mouth daily.    . dicyclomine (BENTYL) 10 MG capsule TAKE 1 CAPSULE BY MOUTH 4  TIMES DAILY - BEFORE MEALS  AND AT BEDTIME. 360 capsule 0  . docusate sodium (COLACE) 100 MG capsule Take 100 mg by mouth 2 (two) times daily.    . glucose blood (ONE TOUCH ULTRA TEST) test strip 1 each by Other route 3 (three) times daily. Use to check blood sugars three times a day Dx E11.9 100 each 3  . glucose blood (ONE TOUCH ULTRA TEST) test strip 1 each by Other route 2 (two) times daily. Use to check blood sugars twice a day Dx e11.9    . IBUPROFEN PO Take by mouth.    . levothyroxine (SYNTHROID, LEVOTHROID) 125 MCG tablet TAKE 1 TABLET BY MOUTH  DAILY BEFORE BREAKFAST 90 tablet 0  . lisinopril (PRINIVIL,ZESTRIL) 5 MG tablet TAKE 1 TABLET BY MOUTH  DAILY 90 tablet 0  . metFORMIN (GLUCOPHAGE) 1000 MG tablet TAKE 1 TABLET BY MOUTH TWO  TIMES DAILY WITH MEALS 180 tablet 0  . naproxen (NAPROSYN) 500 MG tablet TAKE 1 TABLET BY MOUTH  DAILY WITH LUNCH 90 tablet 0  . nortriptyline (PAMELOR) 10 MG capsule Take 3 capsules (30 mg total) by mouth at bedtime. 90 capsule 2  . ONE TOUCH ULTRA TEST test strip USE  TO CHECK BLOOD SUGARS 3 TIMES DAILY 10 each 0  . ONETOUCH DELICA LANCETS 33G MISC USE TO CHECK BLOOD SUGARS 3 TIMES DAILY (ANNUAL APPT  DUE IN DECEMBER) 300 each 0  . tiZANidine (ZANAFLEX) 2 MG tablet TAKE 1 TO 2 TABLETS BY  MOUTH 3 TIMES DAILY AS  NEEDED FOR MUSCLE SPASMS 180 tablet 0   No current facility-administered medications on file prior to visit.    Allergies  Allergen Reactions  .   Penicillins    Family History  Problem Relation Age of Onset  . Alcohol abuse Mother   . Arthritis Mother   . Heart disease Mother   . Alcohol abuse Father   . Arthritis Father   . Heart disease Father   . Diabetes Daughter   . Heart disease Sister   . Diabetes Brother   . Diabetes Brother   . Diabetes Brother   . Diabetes Brother   . Heart disease Brother     PE: BP 106/60   Pulse 78   Ht 5' 5.75" (1.67 m)   Wt 146 lb 12.8 oz (66.6 kg)   LMP 01/15/2013   SpO2 97%   BMI 23.87 kg/m  Wt Readings from Last 3 Encounters:  06/02/17 146 lb 12.8 oz (66.6 kg)  03/10/17 145 lb (65.8 kg)  12/16/16 152 lb (68.9 kg)   Constitutional: normal weight, in NAD Eyes: PERRLA, EOMI, no exophthalmos ENT: moist mucous membranes, no thyromegaly, no cervical lymphadenopathy Cardiovascular: RRR, No MRG Respiratory: CTA B Gastrointestinal: abdomen soft, NT, ND, BS+ Musculoskeletal: no deformities, strength intact in all 4 Skin: moist, warm, no rashes Neurological: no tremor with outstretched hands, DTR normal in all 4  ASSESSMENT: 1. DM2, non-insulin-dependent, uncontrolled, without long term complications  PLAN:  1. Patient with long-standing, uncontrolled diabetes, on oral antidiabetic regimen, currently only with metformin as she cannot afford Januvia anymore.  Her sugars did not significantly change after stopping Januvia, on the background of her dietary change and increase in activity.  She is actually doing a great job with both diet and her walking and at this visit we discussed about further  improving her diet and admits specific changes.  However, per her sugars at home, which are at goal, she does not need a change in regimen at this visit.  I congratulated her for her achievement of dropping her HbA1c from 11.2 at last check 3 months ago to 7.9% today!  I believe that the HbA1c will decrease even further if she continues with the changes that she already started. - I suggested to:  Patient Instructions  Please continue: - Metformin 1000 mg 2x a day, with meals  Can try the following combination for neuropathy: - alpha-lipoic acid 600 mg 2x a day Takes with along with B12.  Please return in 3 months with your sugar log.   - Strongly advised her to start checking sugars at different times of the day - check 1-2 times a day, rotating checks - given sugar log and advised how to fill it and to bring it at next appt  - given foot care handout and explained the principles  - given instructions for hypoglycemia management "15-15 rule"  - advised for yearly eye exams  - Return to clinic in 3 mo with sugar log   Philemon Kingdom, MD PhD [facility] Endocrin[facility]

## 2017-06-02 NOTE — Patient Instructions (Addendum)
Please continue: - Metformin 1000 mg 2x a day, with meals  Can try the following combination for neuropathy: - alpha-lipoic acid 600 mg 2x a day Takes with along with B12.  Please return in 3 months with your sugar log.   PATIENT INSTRUCTIONS FOR TYPE 2 DIABETES:  DIET AND EXERCISE Diet and exercise is an important part of diabetic treatment.  We recommended aerobic exercise in the form of brisk walking (working between 40-60% of maximal aerobic capacity, similar to brisk walking) for 150 minutes per week (such as 30 minutes five days per week) along with 3 times per week performing 'resistance' training (using various gauge rubber tubes with handles) 5-10 exercises involving the major muscle groups (upper body, lower body and core) performing 10-15 repetitions (or near fatigue) each exercise. Start at half the above goal but build slowly to reach the above goals. If limited by weight, joint pain, or disability, we recommend daily walking in a swimming pool with water up to waist to reduce pressure from joints while allow for adequate exercise.    BLOOD GLUCOSES Monitoring your blood glucoses is important for continued management of your diabetes. Please check your blood glucoses 2-4 times a day: fasting, before meals and at bedtime (you can rotate these measurements - e.g. one day check before the 3 meals, the next day check before 2 of the meals and before bedtime, etc.).   HYPOGLYCEMIA (low blood sugar) Hypoglycemia is usually a reaction to not eating, exercising, or taking too much insulin/ other diabetes drugs.  Symptoms include tremors, sweating, hunger, confusion, headache, etc. Treat IMMEDIATELY with 15 grams of Carbs: . 4 glucose tablets .  cup regular juice/soda . 2 tablespoons raisins . 4 teaspoons sugar . 1 tablespoon honey Recheck blood glucose in 15 mins and repeat above if still symptomatic/blood glucose <100.  RECOMMENDATIONS TO REDUCE YOUR RISK OF DIABETIC  COMPLICATIONS: * Take your prescribed MEDICATION(S) * Follow a DIABETIC diet: Complex carbs, fiber rich foods, (monounsaturated and polyunsaturated) fats * AVOID saturated/trans fats, high fat foods, >2,300 mg salt per day. * EXERCISE at least 5 times a week for 30 minutes or preferably daily.  * DO NOT SMOKE OR DRINK more than 1 drink a day. * Check your FEET every day. Do not wear tightfitting shoes. Contact us if you develop an ulcer * See your EYE doctor once a year or more if needed * Get a FLU shot once a year * Get a PNEUMONIA vaccine once before and once after age 54 years  GOALS:  * Your Hemoglobin A1c of <7%  * fasting sugars need to be <130 * after meals sugars need to be <180 (2h after you start eating) * Your Systolic BP should be 140 or lower  * Your Diastolic BP should be 80 or lower  * Your HDL (Good Cholesterol) should be 40 or higher  * Your LDL (Bad Cholesterol) should be 100 or lower. * Your Triglycerides should be 150 or lower  * Your Urine microalbumin (kidney function) should be <30 * Your Body Mass Index should be 25 or lower    Please consider the following ways to cut down carbs and fat and increase fiber and micronutrients in your diet: - substitute whole grain for white bread or pasta - substitute brown rice for white rice - substitute 90-calorie flat bread pieces for slices of bread when possible - substitute sweet potatoes or yams for white potatoes - substitute humus for margarine - substitute tofu for  cheese when possible - substitute almond or rice milk for regular milk (would not drink soy milk daily due to concern for soy estrogen influence on breast cancer risk) - substitute dark chocolate for other sweets when possible - substitute water - can add lemon or orange slices for taste - for diet sodas (artificial sweeteners will trick your body that you can eat sweets without getting calories and will lead you to overeating and weight gain in the long  run) - do not skip breakfast or other meals (this will slow down the metabolism and will result in more weight gain over time)  - can try smoothies made from fruit and almond/rice milk in am instead of regular breakfast - can also try old-fashioned (not instant) oatmeal made with almond/rice milk in am - order the dressing on the side when eating salad at a restaurant (pour less than half of the dressing on the salad) - eat as little meat as possible - can try juicing, but should not forget that juicing will get rid of the fiber, so would alternate with eating raw veg./fruits or drinking smoothies - use as little oil as possible, even when using olive oil - can dress a salad with a mix of balsamic vinegar and lemon juice, for e.g. - use agave nectar, stevia sugar, or regular sugar rather than artificial sweateners - steam or broil/roast veggies  - snack on veggies/fruit/nuts (unsalted, preferably) when possible, rather than processed foods - reduce or eliminate aspartame in diet (it is in diet sodas, chewing gum, etc) Read the labels!  Try to read Dr. Janene Harvey book: "Program for Reversing Diabetes" for other ideas for healthy eating.

## 2017-06-09 ENCOUNTER — Ambulatory Visit (INDEPENDENT_AMBULATORY_CARE_PROVIDER_SITE_OTHER): Payer: Medicare Other | Admitting: Internal Medicine

## 2017-06-09 ENCOUNTER — Encounter: Payer: Self-pay | Admitting: Internal Medicine

## 2017-06-09 VITALS — BP 126/74 | HR 74 | Temp 98.7°F | Resp 20 | Ht 66.0 in | Wt 144.0 lb

## 2017-06-09 DIAGNOSIS — F17210 Nicotine dependence, cigarettes, uncomplicated: Secondary | ICD-10-CM | POA: Diagnosis not present

## 2017-06-09 DIAGNOSIS — E1169 Type 2 diabetes mellitus with other specified complication: Secondary | ICD-10-CM

## 2017-06-09 DIAGNOSIS — E119 Type 2 diabetes mellitus without complications: Secondary | ICD-10-CM | POA: Diagnosis not present

## 2017-06-09 DIAGNOSIS — Z720 Tobacco use: Secondary | ICD-10-CM | POA: Diagnosis not present

## 2017-06-09 DIAGNOSIS — M150 Primary generalized (osteo)arthritis: Secondary | ICD-10-CM | POA: Diagnosis not present

## 2017-06-09 DIAGNOSIS — E785 Hyperlipidemia, unspecified: Secondary | ICD-10-CM | POA: Diagnosis not present

## 2017-06-09 DIAGNOSIS — M159 Polyosteoarthritis, unspecified: Secondary | ICD-10-CM

## 2017-06-09 NOTE — Assessment & Plan Note (Addendum)
Seeing endo and hgA1c much improved with metformin and diet and exercise. She is doing well and encouraged her to continue with these positive changes. Foot exam done. On statin and lisinopril 5 mg daily.

## 2017-06-09 NOTE — Assessment & Plan Note (Signed)
Taking lipitor 40 mg daily, recent lipid panel at goal of LDL <100. Continue therapy.

## 2017-06-09 NOTE — Assessment & Plan Note (Signed)
She is doing well on tizanidine and naproxen currently. We will not prescribe her narcotics in the future and she is aware of this.

## 2017-06-09 NOTE — Assessment & Plan Note (Signed)
Time spent counseling about tobacco usage: 3 minutes. I have asked about smoking and is smoking same as usual. The patient is advised to quit. The patient is not willing to quit. They would like to talk about trying to quit in the next 6 months. We will follow up with them in 6 months.   

## 2017-06-09 NOTE — Patient Instructions (Addendum)
Ask the pharmacy about the shingles vaccine shingrix. This is 2 shots given 2 months apart to reduce your risk of getting shingles.   Health Maintenance, Female Adopting a healthy lifestyle and getting preventive care can go a long way to promote health and wellness. Talk with your health care provider about what schedule of regular examinations is right for you. This is a good chance for you to check in with your provider about disease prevention and staying healthy. In between checkups, there are plenty of things you can do on your own. Experts have done a lot of research about which lifestyle changes and preventive measures are most likely to keep you healthy. Ask your health care provider for more information. Weight and diet Eat a healthy diet  Be sure to include plenty of vegetables, fruits, low-fat dairy products, and lean protein.  Do not eat a lot of foods high in solid fats, added sugars, or salt.  Get regular exercise. This is one of the most important things you can do for your health. ? Most adults should exercise for at least 150 minutes each week. The exercise should increase your heart rate and make you sweat (moderate-intensity exercise). ? Most adults should also do strengthening exercises at least twice a week. This is in addition to the moderate-intensity exercise.  Maintain a healthy weight  Body mass index (BMI) is a measurement that can be used to identify possible weight problems. It estimates body fat based on height and weight. Your health care provider can help determine your BMI and help you achieve or maintain a healthy weight.  For females 65 years of age and older: ? A BMI below 18.5 is considered underweight. ? A BMI of 18.5 to 24.9 is normal. ? A BMI of 25 to 29.9 is considered overweight. ? A BMI of 30 and above is considered obese.  Watch levels of cholesterol and blood lipids  You should start having your blood tested for lipids and cholesterol at 54  years of age, then have this test every 5 years.  You may need to have your cholesterol levels checked more often if: ? Your lipid or cholesterol levels are high. ? You are older than 54 years of age. ? You are at high risk for heart disease.  Cancer screening Lung Cancer  Lung cancer screening is recommended for adults 47-43 years old who are at high risk for lung cancer because of a history of smoking.  A yearly low-dose CT scan of the lungs is recommended for people who: ? Currently smoke. ? Have quit within the past 15 years. ? Have at least a 30-pack-year history of smoking. A pack year is smoking an average of one pack of cigarettes a day for 1 year.  Yearly screening should continue until it has been 15 years since you quit.  Yearly screening should stop if you develop a health problem that would prevent you from having lung cancer treatment.  Breast Cancer  Practice breast self-awareness. This means understanding how your breasts normally appear and feel.  It also means doing regular breast self-exams. Let your health care provider know about any changes, no matter how small.  If you are in your 20s or 30s, you should have a clinical breast exam (CBE) by a health care provider every 1-3 years as part of a regular health exam.  If you are 21 or older, have a CBE every year. Also consider having a breast X-ray (mammogram) every year.  If you have a family history of breast cancer, talk to your health care provider about genetic screening.  If you are at high risk for breast cancer, talk to your health care provider about having an MRI and a mammogram every year.  Breast cancer gene (BRCA) assessment is recommended for women who have family members with BRCA-related cancers. BRCA-related cancers include: ? Breast. ? Ovarian. ? Tubal. ? Peritoneal cancers.  Results of the assessment will determine the need for genetic counseling and BRCA1 and BRCA2 testing.  Cervical  Cancer Your health care provider may recommend that you be screened regularly for cancer of the pelvic organs (ovaries, uterus, and vagina). This screening involves a pelvic examination, including checking for microscopic changes to the surface of your cervix (Pap test). You may be encouraged to have this screening done every 3 years, beginning at age 28.  For women ages 42-65, health care providers may recommend pelvic exams and Pap testing every 3 years, or they may recommend the Pap and pelvic exam, combined with testing for human papilloma virus (HPV), every 5 years. Some types of HPV increase your risk of cervical cancer. Testing for HPV may also be done on women of any age with unclear Pap test results.  Other health care providers may not recommend any screening for nonpregnant women who are considered low risk for pelvic cancer and who do not have symptoms. Ask your health care provider if a screening pelvic exam is right for you.  If you have had past treatment for cervical cancer or a condition that could lead to cancer, you need Pap tests and screening for cancer for at least 20 years after your treatment. If Pap tests have been discontinued, your risk factors (such as having a new sexual partner) need to be reassessed to determine if screening should resume. Some women have medical problems that increase the chance of getting cervical cancer. In these cases, your health care provider may recommend more frequent screening and Pap tests.  Colorectal Cancer  This type of cancer can be detected and often prevented.  Routine colorectal cancer screening usually begins at 54 years of age and continues through 54 years of age.  Your health care provider may recommend screening at an earlier age if you have risk factors for colon cancer.  Your health care provider may also recommend using home test kits to check for hidden blood in the stool.  A small camera at the end of a tube can be used to  examine your colon directly (sigmoidoscopy or colonoscopy). This is done to check for the earliest forms of colorectal cancer.  Routine screening usually begins at age 21.  Direct examination of the colon should be repeated every 5-10 years through 54 years of age. However, you may need to be screened more often if early forms of precancerous polyps or small growths are found.  Skin Cancer  Check your skin from head to toe regularly.  Tell your health care provider about any new moles or changes in moles, especially if there is a change in a mole's shape or color.  Also tell your health care provider if you have a mole that is larger than the size of a pencil eraser.  Always use sunscreen. Apply sunscreen liberally and repeatedly throughout the day.  Protect yourself by wearing long sleeves, pants, a wide-brimmed hat, and sunglasses whenever you are outside.  Heart disease, diabetes, and high blood pressure  High blood pressure causes heart disease and  increases the risk of stroke. High blood pressure is more likely to develop in: ? People who have blood pressure in the high end of the normal range (130-139/85-89 mm Hg). ? People who are overweight or obese. ? People who are African American.  If you are 73-54 years of age, have your blood pressure checked every 3-5 years. If you are 23 years of age or older, have your blood pressure checked every year. You should have your blood pressure measured twice-once when you are at a hospital or clinic, and once when you are not at a hospital or clinic. Record the average of the two measurements. To check your blood pressure when you are not at a hospital or clinic, you can use: ? An automated blood pressure machine at a pharmacy. ? A home blood pressure monitor.  If you are between 67 years and 20 years old, ask your health care provider if you should take aspirin to prevent strokes.  Have regular diabetes screenings. This involves taking a  blood sample to check your fasting blood sugar level. ? If you are at a normal weight and have a low risk for diabetes, have this test once every three years after 54 years of age. ? If you are overweight and have a high risk for diabetes, consider being tested at a younger age or more often. Preventing infection Hepatitis B  If you have a higher risk for hepatitis B, you should be screened for this virus. You are considered at high risk for hepatitis B if: ? You were born in a country where hepatitis B is common. Ask your health care provider which countries are considered high risk. ? Your parents were born in a high-risk country, and you have not been immunized against hepatitis B (hepatitis B vaccine). ? You have HIV or AIDS. ? You use needles to inject street drugs. ? You live with someone who has hepatitis B. ? You have had sex with someone who has hepatitis B. ? You get hemodialysis treatment. ? You take certain medicines for conditions, including cancer, organ transplantation, and autoimmune conditions.  Hepatitis C  Blood testing is recommended for: ? Everyone born from [birth year] through 1963. ? Anyone with known risk factors for hepatitis C.  Sexually transmitted infections (STIs)  You should be screened for sexually transmitted infections (STIs) including gonorrhea and chlamydia if: ? You are sexually active and are younger than 54 years of age. ? You are older than 54 years of age and your health care provider tells you that you are at risk for this type of infection. ? Your sexual activity has changed since you were last screened and you are at an increased risk for chlamydia or gonorrhea. Ask your health care provider if you are at risk.  If you do not have HIV, but are at risk, it may be recommended that you take a prescription medicine daily to prevent HIV infection. This is called pre-exposure prophylaxis (PrEP). You are considered at risk if: ? You are sexually active and  do not regularly use condoms or know the HIV status of your partner(s). ? You take drugs by injection. ? You are sexually active with a partner who has HIV.  Talk with your health care provider about whether you are at high risk of being infected with HIV. If you choose to begin PrEP, you should first be tested for HIV. You should then be tested every 3 months for as long as you are taking  PrEP. Pregnancy  If you are premenopausal and you may become pregnant, ask your health care provider about preconception counseling.  If you may become pregnant, take 400 to 800 micrograms (mcg) of folic acid every day.  If you want to prevent pregnancy, talk to your health care provider about birth control (contraception). Osteoporosis and menopause  Osteoporosis is a disease in which the bones lose minerals and strength with aging. This can result in serious bone fractures. Your risk for osteoporosis can be identified using a bone density scan.  If you are 45 years of age or older, or if you are at risk for osteoporosis and fractures, ask your health care provider if you should be screened.  Ask your health care provider whether you should take a calcium or vitamin D supplement to lower your risk for osteoporosis.  Menopause may have certain physical symptoms and risks.  Hormone replacement therapy may reduce some of these symptoms and risks. Talk to your health care provider about whether hormone replacement therapy is right for you. Follow these instructions at home:  Schedule regular health, dental, and eye exams.  Stay current with your immunizations.  Do not use any tobacco products including cigarettes, chewing tobacco, or electronic cigarettes.  If you are pregnant, do not drink alcohol.  If you are breastfeeding, limit how much and how often you drink alcohol.  Limit alcohol intake to no more than 1 drink per day for nonpregnant women. One drink equals 12 ounces of beer, 5 ounces of  wine, or 1 ounces of hard liquor.  Do not use street drugs.  Do not share needles.  Ask your health care provider for help if you need support or information about quitting drugs.  Tell your health care provider if you often feel depressed.  Tell your health care provider if you have ever been abused or do not feel safe at home. This information is not intended to replace advice given to you by your health care provider. Make sure you discuss any questions you have with your health care provider. Document Released: 12/14/2010 Document Revised: 11/06/2015 Document Reviewed: 03/04/2015 Elsevier Interactive Patient Education  [employer].

## 2017-06-09 NOTE — Progress Notes (Signed)
   Subjective:    Patient ID: Shelia Leon, female    DOB: 10/08/1960, 56 y.o.   MRN: [id number]  HPI The patient is a 56 YO female coming in for follow up of her medical problems including her cholesterol (taking lipitor 40 mg daily, denies side effects, denies chest pains or SOB or weakness), and her smoking (still smoking about 1/2 PPD, has thought about quitting, denies SOB or chronic cough) and her neck pain (taking tizanidine, naproxen, previously was on hydrocodone but several negative UDS screens and we have stopped prescribing these for her). She is doing better with diet and exercise and seeing endo and doing better with her diabetes. She is walking some which has helped her.   Review of Systems  Constitutional: Negative.   HENT: Negative.   Eyes: Negative.   Respiratory: Negative for cough, chest tightness and shortness of breath.   Cardiovascular: Negative for chest pain, palpitations and leg swelling.  Gastrointestinal: Negative for abdominal distention, abdominal pain, constipation, diarrhea, nausea and vomiting.  Musculoskeletal: Negative.   Skin: Negative.   Neurological: Negative.   Psychiatric/Behavioral: Negative.       Objective:   Physical Exam  Constitutional: She is oriented to person, place, and time. She appears well-developed and well-nourished.  HENT:  Head: Normocephalic and atraumatic.  Eyes: EOM are normal.  Neck: Normal range of motion.  Cardiovascular: Normal rate and regular rhythm.  Pulmonary/Chest: Effort normal and breath sounds normal. No respiratory distress. She has no wheezes. She has no rales.  Abdominal: Soft. Bowel sounds are normal. She exhibits no distension. There is no tenderness. There is no rebound.  Musculoskeletal: She exhibits no edema.  Neurological: She is alert and oriented to person, place, and time. Coordination normal.  Skin: Skin is warm and dry.  Psychiatric: She has a normal mood and affect.   Vitals:   06/09/17 0905    BP: 126/74  Pulse: 74  Resp: 20  Temp: 98.7 F (37.1 C)  SpO2: 98%  Weight: 144 lb (65.3 kg)  Height: 5\' 6"  (1.676 m)      Assessment & Plan:

## 2017-06-27 ENCOUNTER — Ambulatory Visit (INDEPENDENT_AMBULATORY_CARE_PROVIDER_SITE_OTHER): Payer: Medicare Other | Admitting: Internal Medicine

## 2017-06-27 ENCOUNTER — Encounter: Payer: Self-pay | Admitting: Internal Medicine

## 2017-06-27 DIAGNOSIS — J0111 Acute recurrent frontal sinusitis: Secondary | ICD-10-CM

## 2017-06-27 DIAGNOSIS — J329 Chronic sinusitis, unspecified: Secondary | ICD-10-CM | POA: Insufficient documentation

## 2017-06-27 MED ORDER — GLUCOSE BLOOD VI STRP: 1.0000 | ORAL_STRIP | Freq: Three times a day (TID) | 3 refills | Status: AC

## 2017-06-27 MED ORDER — ATORVASTATIN CALCIUM 40 MG PO TABS: 40.0000 mg | ORAL_TABLET | Freq: Every day | ORAL | 3 refills | Status: DC

## 2017-06-27 MED ORDER — NAPROXEN 500 MG PO TABS: ORAL_TABLET | ORAL | 0 refills | Status: DC

## 2017-06-27 MED ORDER — METFORMIN HCL 1000 MG PO TABS: ORAL_TABLET | ORAL | 3 refills | Status: DC

## 2017-06-27 MED ORDER — DOXYCYCLINE HYCLATE 100 MG PO TABS: 100.0000 mg | ORAL_TABLET | Freq: Two times a day (BID) | ORAL | 0 refills | Status: DC

## 2017-06-27 MED ORDER — TIZANIDINE HCL 2 MG PO TABS: ORAL_TABLET | ORAL | 6 refills | Status: DC

## 2017-06-27 MED ORDER — LEVOTHYROXINE SODIUM 125 MCG PO TABS: ORAL_TABLET | ORAL | 1 refills | Status: DC

## 2017-06-27 MED ORDER — DICYCLOMINE HCL 10 MG PO CAPS: ORAL_CAPSULE | ORAL | 3 refills | Status: DC

## 2017-06-27 MED ORDER — LISINOPRIL 5 MG PO TABS: 5.0000 mg | ORAL_TABLET | Freq: Every day | ORAL | 3 refills | Status: DC

## 2017-06-27 NOTE — Progress Notes (Signed)
   Subjective:    Patient ID: Shelia Leon, female    DOB: 09/19/1960, 57 y.o.   MRN: [id number]  HPI The patient is a 57 YO female coming in for sinus problems. She has had these off and on and worse in the last 1-2 weeks. She is having left ear pain severe, sinus pressure. She is having headaches. Some fevers and chills. Is not taking anything for it. Does have some cough but no SOB. She denies putting anything in her left ear except some cotton balls to protect against the cold when going out. No q tips or bobby pins. She has had a nosebleed in the last couple of days which stopped with pressure. Overall worsening. No muscle aches and no sick contacts.   Review of Systems  Constitutional: Positive for activity change, appetite change and chills. Negative for fatigue, fever and unexpected weight change.  HENT: Positive for congestion, ear discharge, ear pain, postnasal drip, rhinorrhea, sinus pressure, sinus pain and sore throat. Negative for sneezing, tinnitus, trouble swallowing and voice change.   Eyes: Negative.   Respiratory: Positive for cough. Negative for chest tightness, shortness of breath and wheezing.   Cardiovascular: Negative.   Gastrointestinal: Negative.   Neurological: Negative.       Objective:   Physical Exam  Constitutional: She is oriented to person, place, and time. She appears well-developed and well-nourished.  HENT:  Head: Normocephalic and atraumatic.  Oropharynx with redness and clear drainage, nose with swollen turbinates, TMs left with some bleeding and bulging, right TM normal. Frontal sinus tenderness.   Eyes: EOM are normal.  Neck: Normal range of motion. No thyromegaly present.  Cardiovascular: Normal rate and regular rhythm.  Pulmonary/Chest: Effort normal and breath sounds normal. No respiratory distress. She has no wheezes. She has no rales.  Abdominal: Soft.  Musculoskeletal: She exhibits tenderness.  Lymphadenopathy:    She has no cervical  adenopathy.  Neurological: She is alert and oriented to person, place, and time.  Skin: Skin is warm and dry.   Vitals:   06/27/17 1541  BP: 124/70  Pulse: 76  Temp: 98.4 F (36.9 C)  TempSrc: Oral  SpO2: 98%  Weight: 147 lb (66.7 kg)  Height: 5\' 6"  (1.676 m)      Assessment & Plan:

## 2017-06-27 NOTE — Assessment & Plan Note (Signed)
Rx for doxycyline given pcn allergy. Advised to start taking zyrtec over the counter as well.

## 2017-06-27 NOTE — Patient Instructions (Signed)
We have sent in the doxycycline to take 1 pill twice a day for 10 days.   Start taking zyrtec over the counter. The generic name for this is cetirizine and you can buy the [facility] or [facility] brand of it.

## 2017-07-10 ENCOUNTER — Other Ambulatory Visit: Payer: Self-pay | Admitting: Internal Medicine

## 2017-07-30 ENCOUNTER — Other Ambulatory Visit: Payer: Self-pay | Admitting: Internal Medicine

## 2017-08-16 ENCOUNTER — Telehealth: Payer: Self-pay | Admitting: Internal Medicine

## 2017-08-16 NOTE — Telephone Encounter (Signed)
Attempted to call patient to schedule Annual Wellness Visit, but patient did not answer. Will try to call patient again at a later time. SF °

## 2017-08-17 ENCOUNTER — Other Ambulatory Visit: Payer: Self-pay | Admitting: Internal Medicine

## 2017-08-17 ENCOUNTER — Other Ambulatory Visit: Payer: Self-pay | Admitting: Neurology

## 2017-08-17 DIAGNOSIS — Z1231 Encounter for screening mammogram for malignant neoplasm of breast: Secondary | ICD-10-CM

## 2017-08-31 ENCOUNTER — Ambulatory Visit: Payer: Medicare Other | Admitting: Internal Medicine

## 2017-08-31 ENCOUNTER — Encounter: Payer: Self-pay | Admitting: Internal Medicine

## 2017-08-31 VITALS — BP 110/64 | HR 99 | Ht 66.0 in | Wt 142.4 lb

## 2017-08-31 DIAGNOSIS — E785 Hyperlipidemia, unspecified: Secondary | ICD-10-CM | POA: Diagnosis not present

## 2017-08-31 DIAGNOSIS — R252 Cramp and spasm: Secondary | ICD-10-CM

## 2017-08-31 DIAGNOSIS — E1169 Type 2 diabetes mellitus with other specified complication: Secondary | ICD-10-CM | POA: Diagnosis not present

## 2017-08-31 DIAGNOSIS — E119 Type 2 diabetes mellitus without complications: Secondary | ICD-10-CM | POA: Diagnosis not present

## 2017-08-31 LAB — HEMOGLOBIN A1C: Hgb A1c MFr Bld: 11.4 % — ABNORMAL HIGH (ref 4.6–6.5)

## 2017-08-31 MED ORDER — ONETOUCH ULTRA 2 W/DEVICE KIT: PACK | 0 refills | Status: AC

## 2017-08-31 NOTE — Progress Notes (Addendum)
Patient ID: Shelia Leon, female   DOB: [birth year], 57 y.o.   MRN: [id number]   HPI: Shelia Leon is a 57 y.o.-year-old female, returning for follow-up for DM2, dx in ~2000, non-insulin-dependent, uncontrolled, without long term complications.  Last visit 3 months ago.  After her HbA1c returned very high in 02/2017, she changed her diet (more veggies, less coffee, less cigarettes), also started walking.  Her sugars significantly decreased since then.  However, since last visit, she tells me that she had some unusual readings by her meter, which is also giving her errors.  She even had some readings in the 500s alternating immediately with sugars in the 200s.  However, overall, her sugars are at or close to goal, and even has mild lows before lunch.  She had a nurse home visit recently and an HbA1c was checked and this was higher, at 9.0%.  Last hemoglobin A1c was: 08/23/2017: HbA1c 9.0% at home Lab Results  Component Value Date   HGBA1C 7.9 06/02/2017   HGBA1C 11.2 (H) 03/10/2017   HGBA1C 8.7% 12/08/2016   Pt is on a regimen of: - Metformin 1000 mg twice a day with meals She was also on Januvia 100 mg in am >> stopped  b/c $. She was on Glipizide >> low CBGs.  Pt checks her sugars 1-2x - but feels her meter is broken: - am: 125-130 >> 89-150 - 2h after b'fast: n/c - before lunch: n/c >> 69-70's 2x a month - 2h after lunch: n/c - before dinner: n/c >> 140s - 2h after dinner: 120-135 >> 160 - bedtime: n/c - nighttime: n/c Lowest sugar was 62 (not since stopping Glipizide) >> 89; she has hypoglycemia awareness at 70s. Highest sugar was 300s >> 500 - but meter broken.  Glucometer: OneTouch Ultra  Pt's meals are: - Breakfast: b'fast bar; coffee; wheat toast + cheerios + regular milk - Lunch: veggies + banana + water - Dinner: brown rice + occas. Steak or fish or chicken + veggies; tacos - Snacks: cup cakes - diabetic No sodas, diluted ice tea.  -No CKD, last BUN/creatinine:   Lab Results  Component Value Date   BUN 10 03/10/2017   BUN 8 06/10/2016   CREATININE 0.82 03/10/2017   CREATININE 0.85 06/10/2016  On lisinopril 5. - + HL;  last set of lipids: Lab Results  Component Value Date   CHOL 118 03/10/2017   HDL 42.60 03/10/2017   LDLCALC 54 03/10/2017   TRIG 108.0 03/10/2017   CHOLHDL 3 03/10/2017  On Lipitor 40. - last eye exam was in 2017: No DR. + low grade catarcts. - + numbness and tingling in her feet - whole foot. Has cramps in legs. She takes a B12 vitamin.  At last visit, I suggested alpha lipoic acid >> she started but did not help.  Pt has FH of DM in daughter, 3x brothers.  Is a history of hypothyroidism, on levothyroxine 125 mcg daily.  Latest TSH was normal: Lab Results  Component Value Date   TSH 0.66 03/10/2017   ROS: Constitutional: no weight gain/no weight loss, no fatigue, no subjective hyperthermia, no subjective hypothermia Eyes: no blurry vision, no xerophthalmia ENT: no sore throat, no nodules palpated in throat, no dysphagia, no odynophagia, no hoarseness Cardiovascular: no CP/no SOB/no palpitations/no leg swelling Respiratory: no cough/no SOB/no wheezing Gastrointestinal: no N/no V/no D/no C/no acid reflux Musculoskeletal: + muscle aches/+ joint aches Skin: no rashes, + hair loss Neurological: no tremors/+ numbness/+ tingling/no dizziness  I reviewed pt's  medications, allergies, PMH, social hx, family hx, and changes were documented in the history of present illness. Otherwise, unchanged from my initial visit note.  Past Medical History:  Diagnosis Date  . Anemia   . Arthritis   . Diabetes mellitus without complication ([place])   . Hyperlipidemia   . Osteopenia 02/2017   T score -2.4 FRAX 2.3%/0.2%  . Thyroid disease    Past Surgical History:  Procedure Laterality Date  . BUNIONECTOMY Right 1987  . CERVICAL SPINE SURGERY     hardware   Social History   Socioeconomic History  . Marital status: Married     Spouse name: Not on file  . Number of children: Not on file  . Years of education: Not on file  . Highest education level: Not on file  Social Needs  . Financial resource strain: Not on file  . Food insecurity - worry: Not on file  . Food insecurity - inability: Not on file  . Transportation needs - medical: Not on file  . Transportation needs - non-medical: Not on file  Occupational History  . Not on file  Tobacco Use  . Smoking status: Current Every Day Smoker    Packs/day: 0.40    Years: 34.00    Pack years: 13.60    Types: Cigarettes  . Smokeless tobacco: Never Used  . Tobacco comment: 5-7 cigs a day   Substance and Sexual Activity  . Alcohol use: No  . Drug use: No  . Sexual activity: Yes    Birth control/protection: Post-menopausal    Comment: 1st intercourse- 80, partners - 6  Other Topics Concern  . Not on file  Social History Narrative   Lives with husband in a 2 story house.  She had two grown children.   She previously worked as Quarry manager, stopped working in 2011.  She has been on disability since then.      States that it is very painful going up and down stairs.    Sleeps upstairs so she only makes one trip a day up at bedtime.      High school education.   Current Outpatient Medications on File Prior to Visit  Medication Sig Dispense Refill  . Alcohol Swabs (ALCOHOL PREPS) PADS by Does not apply route.    Marland Kitchen atorvastatin (LIPITOR) 40 MG tablet TAKE 1 TABLET BY MOUTH AT  BEDTIME 90 tablet 1  . Blood Glucose Monitoring Suppl (ONE TOUCH ULTRA 2) w/Device KIT USE TO CHECK BLOOD SUGARS  EVERY DAY 1 each 0  . Cholecalciferol (VITAMIN D3) 2000 UNITS TABS Take 2,000 Units by mouth daily.    . cyanocobalamin 1000 MCG tablet Take 100 mcg by mouth daily.    Marland Kitchen dicyclomine (BENTYL) 10 MG capsule TAKE 1 CAPSULE BY MOUTH 4  TIMES DAILY - BEFORE MEALS  AND AT BEDTIME 360 capsule 1  . docusate sodium (COLACE) 100 MG capsule Take 100 mg by mouth 2 (two) times daily.    Marland Kitchen doxycycline  (VIBRA-TABS) 100 MG tablet Take 1 tablet (100 mg total) by mouth 2 (two) times daily. 20 tablet 0  . glucose blood (ONE TOUCH ULTRA TEST) test strip 1 each by Other route 3 (three) times daily. Use to check blood sugars three times a day Dx E11.9 100 each 3  . IBUPROFEN PO Take by mouth.    . levothyroxine (SYNTHROID, LEVOTHROID) 125 MCG tablet TAKE 1 TABLET BY MOUTH  DAILY BEFORE BREAKFAST 90 tablet 1  . lisinopril (PRINIVIL,ZESTRIL) 5 MG tablet  TAKE 1 TABLET BY MOUTH  DAILY 90 tablet 1  . metFORMIN (GLUCOPHAGE) 1000 MG tablet TAKE 1 TABLET BY MOUTH TWO  TIMES DAILY WITH MEALS 180 tablet 1  . naproxen (NAPROSYN) 500 MG tablet TAKE 1 TABLET BY MOUTH  DAILY WITH LUNCH 90 tablet 0  . nortriptyline (PAMELOR) 10 MG capsule TAKE 3 CAPSULES BY MOUTH AT BEDTIME 90 capsule 2  . ONETOUCH DELICA LANCETS 29J MISC USE AS DIRECTED TO CHECK  BLOOD SUGARS 3 TIMES DAILY  (ANNUAL APPT DUE IN  DECEMBER) 300 each 0  . tiZANidine (ZANAFLEX) 2 MG tablet TAKE 1 TO 2 TABLETS BY  MOUTH 3 TIMES DAILY AS  NEEDED FOR MUSCLE SPASMS 180 tablet 1   No current facility-administered medications on file prior to visit.    Allergies  Allergen Reactions  . Penicillins    Family History  Problem Relation Age of Onset  . Alcohol abuse Mother   . Arthritis Mother   . Heart disease Mother   . Alcohol abuse Father   . Arthritis Father   . Heart disease Father   . Diabetes Daughter   . Heart disease Sister   . Diabetes Brother   . Diabetes Brother   . Diabetes Brother   . Diabetes Brother   . Heart disease Brother     PE: BP 110/64   Pulse 99   Ht 5' 6"  (1.676 m)   Wt 142 lb 6.4 oz (64.6 kg)   LMP 01/15/2013   SpO2 99%   BMI 22.98 kg/m   Wt Readings from Last 3 Encounters:  08/31/17 142 lb 6.4 oz (64.6 kg)  06/27/17 147 lb (66.7 kg)  06/09/17 144 lb (65.3 kg)   Constitutional: Normal weight, in NAD Eyes: PERRLA, EOMI, no exophthalmos ENT: moist mucous membranes, no thyromegaly, no cervical  lymphadenopathy Cardiovascular: no tachycardia, RR, No MRG Respiratory: CTA B Gastrointestinal: abdomen soft, NT, ND, BS+ Musculoskeletal: no deformities, strength intact in all 4 Skin: moist, warm, no rashes Neurological: no tremor with outstretched hands, DTR normal in all 4  ASSESSMENT: 1. DM2, non-insulin-dependent, uncontrolled, without long term complications  2. HL  3.  Muscle cramps  PLAN:  1. Patient with long-standing, uncontrolled, diabetes, on oral antidiabetic regimen, only on metformin as she could not afford Januvia anymore.  Her sugars did not significantly change after stopping Januvia as she also started to make dietary changes and increase in activity.  At last visit her HbA1c was 7.9%, decreased from 11.2% 3 months prior.  Because of the significant improvement, we did not change her regimen. - At this visit, despite her continuing the diet and staying active, her sugars appear to be fluctuating, but she believes there is a problem with her meter since she even got readings in the 500s immediately alternating with sugars in the 200s.  Overall, the sugars are at or close to goal, so I will not make any changes in her regimen at this visit but I advised her to start checking sugars with a new meter and send them to me in 2 weeks.  In the meantime, we will also check an HbA1c today.  If the results are abnormal, we may need to start a DPP 4 inhibitor, for example. - I suggested to:  Patient Instructions  Please continue: - Metformin 1000 mg 2x a day with meals  Let me know about the sugars in 2 weeks.  Try: - Muscle Calm spray ([employer] - 5$) - Theraworx cream ([employer] - 20$)  Please stop at the lab.  Please return in 3 months with your sugar log.   - today we will check an HbA1c, unfortunately, we are out of the point of care cartridges and we have to check this at the end of the visit - continue checking sugars at different times of the day - check 1-2x a day,  rotating checks - advised for yearly eye exams >> she is due. - Return to clinic in 3 mo with sugar log   2. HL - reviewed latest lipid panel from 02/2017: LDL at goal - Continues Lipitor without side effects  3.  Muscle cramps -I suggested: - Muscle Calm spray ([employer] - 5$) - Theraworx cream ([employer] - 20$) -I also suggested magnesium supplements  Office Visit on 08/31/2017  Component Date Value Ref Range Status  . Hgb A1c MFr Bld 08/31/2017 11.4* 4.6 - 6.5 % Final   Glycemic Control Guidelines for People with Diabetes:Non Diabetic:  <6%Goal of Therapy: <7%Additional Action Suggested:  >8%    HbA1c is higher, unexplained.  I will ask her to start checking sugars at home and contact me if she has high sugars immediately.  We will probably need to start insulin at least for period of time and then check her insulin production.  Philemon Kingdom, MD PhD [facility] Endocrinology

## 2017-08-31 NOTE — Patient Instructions (Addendum)
Please continue: - Metformin 1000 mg 2x a day with meals  Let me know about the sugars in 2 weeks.  Try: - Muscle Calm spray ([employer] - 5$) - Theraworx cream ([employer] - 20$)  Please stop at the lab.  Please return in 3 months with your sugar log.

## 2017-09-06 ENCOUNTER — Encounter: Payer: Self-pay | Admitting: Internal Medicine

## 2017-09-13 ENCOUNTER — Ambulatory Visit
Admission: RE | Admit: 2017-09-13 | Discharge: 2017-09-13 | Disposition: A | Discharge status: Home / Self Care | Payer: Medicare Other | Source: Ambulatory Visit | Attending: Internal Medicine | Admitting: Internal Medicine

## 2017-09-13 DIAGNOSIS — Z1231 Encounter for screening mammogram for malignant neoplasm of breast: Secondary | ICD-10-CM

## 2017-09-17 ENCOUNTER — Other Ambulatory Visit: Payer: Self-pay | Admitting: Internal Medicine

## 2017-09-20 ENCOUNTER — Encounter (HOSPITAL_COMMUNITY): Payer: Self-pay | Admitting: Emergency Medicine

## 2017-09-20 ENCOUNTER — Other Ambulatory Visit: Payer: Self-pay

## 2017-09-20 ENCOUNTER — Ambulatory Visit (HOSPITAL_COMMUNITY)
Admission: EM | Admit: 2017-09-20 | Discharge: 2017-09-20 | Disposition: A | Discharge status: Home / Self Care | Payer: Medicare Other | Attending: Family Medicine | Admitting: Family Medicine

## 2017-09-20 DIAGNOSIS — Z79899 Other long term (current) drug therapy: Secondary | ICD-10-CM | POA: Insufficient documentation

## 2017-09-20 DIAGNOSIS — E119 Type 2 diabetes mellitus without complications: Secondary | ICD-10-CM | POA: Diagnosis not present

## 2017-09-20 DIAGNOSIS — Z87440 Personal history of urinary (tract) infections: Secondary | ICD-10-CM | POA: Insufficient documentation

## 2017-09-20 DIAGNOSIS — Z880 Allergy status to penicillin: Secondary | ICD-10-CM | POA: Insufficient documentation

## 2017-09-20 DIAGNOSIS — E1142 Type 2 diabetes mellitus with diabetic polyneuropathy: Secondary | ICD-10-CM | POA: Diagnosis not present

## 2017-09-20 DIAGNOSIS — R109 Unspecified abdominal pain: Secondary | ICD-10-CM

## 2017-09-20 DIAGNOSIS — E1169 Type 2 diabetes mellitus with other specified complication: Secondary | ICD-10-CM | POA: Insufficient documentation

## 2017-09-20 DIAGNOSIS — M1991 Primary osteoarthritis, unspecified site: Secondary | ICD-10-CM | POA: Insufficient documentation

## 2017-09-20 DIAGNOSIS — F17210 Nicotine dependence, cigarettes, uncomplicated: Secondary | ICD-10-CM | POA: Insufficient documentation

## 2017-09-20 DIAGNOSIS — E559 Vitamin D deficiency, unspecified: Secondary | ICD-10-CM | POA: Insufficient documentation

## 2017-09-20 DIAGNOSIS — D638 Anemia in other chronic diseases classified elsewhere: Secondary | ICD-10-CM | POA: Diagnosis not present

## 2017-09-20 DIAGNOSIS — Z7984 Long term (current) use of oral hypoglycemic drugs: Secondary | ICD-10-CM | POA: Insufficient documentation

## 2017-09-20 DIAGNOSIS — E039 Hypothyroidism, unspecified: Secondary | ICD-10-CM | POA: Insufficient documentation

## 2017-09-20 DIAGNOSIS — R197 Diarrhea, unspecified: Secondary | ICD-10-CM | POA: Insufficient documentation

## 2017-09-20 DIAGNOSIS — E538 Deficiency of other specified B group vitamins: Secondary | ICD-10-CM | POA: Insufficient documentation

## 2017-09-20 DIAGNOSIS — M8580 Other specified disorders of bone density and structure, unspecified site: Secondary | ICD-10-CM | POA: Insufficient documentation

## 2017-09-20 DIAGNOSIS — E785 Hyperlipidemia, unspecified: Secondary | ICD-10-CM | POA: Insufficient documentation

## 2017-09-20 LAB — POCT URINALYSIS DIP (DEVICE)
Glucose, UA: 100 mg/dL — AB
HGB URINE DIPSTICK: NEGATIVE
KETONES UR: NEGATIVE mg/dL
Leukocytes, UA: NEGATIVE
Nitrite: NEGATIVE
PH: 7 (ref 5.0–8.0)
Protein, ur: NEGATIVE mg/dL
SPECIFIC GRAVITY, URINE: 1.02 (ref 1.005–1.030)
Urobilinogen, UA: 1 mg/dL (ref 0.0–1.0)

## 2017-09-20 LAB — GLUCOSE, CAPILLARY: Glucose-Capillary: 195 mg/dL — ABNORMAL HIGH (ref 65–99)

## 2017-09-20 NOTE — ED Provider Notes (Signed)
[facility]    CSN: [id number] Arrival date & time: 09/20/17  1118  History   Chief Complaint Chief Complaint  Patient presents with  . Abdominal Pain   HPI Shelia Leon is a 57 y.o. female.   With history of T2DM, hyperlipidemia, osteoarthritis, osteopenia, anemia and hypothyroidism, presents today for 2-day duration of intermittent abdominal cramp associated with diarrhea. She reports to have chronic diarrhea anyway secondary to metformin.   Reports that her abdominal pain feels like her previous UTI. She doesn't have frequent hx of UTI. Endorses pain to be at the lower abdomen. Pain is 7/10. No dysuria, does have urinary frequency from increased water intake, no urgency reported. Did feel nauseous yesterday. No fever reported. No SOB, no CP, headache, or dizziness.      Past Medical History:  Diagnosis Date  . Anemia   . Arthritis   . Diabetes mellitus without complication ([place])   . Hyperlipidemia   . Osteopenia 02/2017   T score -2.4 FRAX 2.3%/0.2%  . Thyroid disease     Patient Active Problem List   Diagnosis Date Noted  . Sinusitis 06/27/2017  . Hyperlipidemia associated with type 2 diabetes mellitus ([place]) 06/09/2017  . Muscle cramps 09/03/2016  . Tobacco abuse 06/10/2016  . Preventative health care 01/17/2014  . Unspecified vitamin D deficiency 01/15/2014  . Hypothyroidism 01/15/2014  . Type 2 diabetes mellitus without complication, without long-term current use of insulin ([place]) 01/15/2014  . Anemia, chronic disease 01/15/2014  . B12 deficiency 01/15/2014  . Neuropathy 01/15/2014  . Primary osteoarthritis involving multiple joints 01/15/2014    Past Surgical History:  Procedure Laterality Date  . BUNIONECTOMY Right 1987  . CERVICAL SPINE SURGERY     hardware    OB History    Gravida  2   Para  2   Term      Preterm      AB      Living  2     SAB      TAB      Ectopic      Multiple      Live Births                Home Medications    Prior to Admission medications   Medication Sig Start Date End Date Taking? Authorizing Provider  atorvastatin (LIPITOR) 40 MG tablet TAKE 1 TABLET BY MOUTH AT  BEDTIME 07/11/17  Yes Hoyt Koch, MD  Blood Glucose Monitoring Suppl (ONE TOUCH ULTRA 2) w/Device KIT USE TO CHECK BLOOD SUGARS  EVERY DAY 08/31/17  Yes Philemon Kingdom, MD  Cholecalciferol (VITAMIN D3) 2000 UNITS TABS Take 2,000 Units by mouth daily.   Yes [provider]  cyanocobalamin 1000 MCG tablet Take 100 mcg by mouth daily.   Yes [provider]  dicyclomine (BENTYL) 10 MG capsule TAKE 1 CAPSULE BY MOUTH 4  TIMES DAILY - BEFORE MEALS  AND AT BEDTIME 08/18/17  Yes Plotnikov, Evie Lacks, MD  docusate sodium (COLACE) 100 MG capsule Take 100 mg by mouth 2 (two) times daily.   Yes [provider]  glucose blood (ONE TOUCH ULTRA TEST) test strip 1 each by Other route 3 (three) times daily. Use to check blood sugars three times a day Dx E11.9 06/27/17  Yes Hoyt Koch, MD  IBUPROFEN PO Take by mouth.   Yes [provider]  levothyroxine (SYNTHROID, LEVOTHROID) 125 MCG tablet TAKE 1 TABLET BY MOUTH  DAILY BEFORE BREAKFAST 07/11/17  Yes Hoyt Koch, MD  lisinopril (PRINIVIL,ZESTRIL) 5 MG tablet TAKE 1 TABLET BY MOUTH  DAILY 07/11/17  Yes Hoyt Koch, MD  metFORMIN (GLUCOPHAGE) 1000 MG tablet TAKE 1 TABLET BY MOUTH TWO  TIMES DAILY WITH MEALS 07/11/17  Yes Hoyt Koch, MD  naproxen (NAPROSYN) 500 MG tablet TAKE 1 TABLET BY MOUTH  DAILY WITH LUNCH 09/19/17  Yes Hoyt Koch, MD  nortriptyline (PAMELOR) 10 MG capsule TAKE 3 CAPSULES BY MOUTH AT BEDTIME 08/18/17  Yes Patel, Donika K, DO  ONETOUCH DELICA LANCETS 70B MISC USE AS DIRECTED TO CHECK  BLOOD SUGARS 3 TIMES DAILY  (ANNUAL APPT DUE IN  DECEMBER) 07/11/17  Yes Hoyt Koch, MD  tiZANidine (ZANAFLEX) 2 MG tablet TAKE 1 TO 2 TABLETS BY  MOUTH 3 TIMES DAILY AS  NEEDED  FOR MUSCLE SPASMS 08/18/17  Yes Plotnikov, Evie Lacks, MD  Alcohol Swabs (ALCOHOL PREPS) PADS by Does not apply route.    [provider]  doxycycline (VIBRA-TABS) 100 MG tablet Take 1 tablet (100 mg total) by mouth 2 (two) times daily. 06/27/17   Hoyt Koch, MD    Family History Family History  Problem Relation Age of Onset  . Alcohol abuse Mother   . Arthritis Mother   . Heart disease Mother   . Alcohol abuse Father   . Arthritis Father   . Heart disease Father   . Diabetes Daughter   . Heart disease Sister   . Diabetes Brother   . Diabetes Brother   . Diabetes Brother   . Diabetes Brother   . Heart disease Brother     Social History Social History   Tobacco Use  . Smoking status: Current Every Day Smoker    Packs/day: 0.40    Years: 34.00    Pack years: 13.60    Types: Cigarettes  . Smokeless tobacco: Never Used  . Tobacco comment: 5-7 cigs a day   Substance Use Topics  . Alcohol use: No  . Drug use: No   Allergies   Penicillins  Review of Systems Review of Systems  Constitutional:       See HPI    Physical Exam Triage Vital Signs ED Triage Vitals  Enc Vitals Group     BP 09/20/17 1217 106/72     Pulse Rate 09/20/17 1217 71     Resp --      Temp 09/20/17 1217 98.1 F (36.7 C)     Temp Source 09/20/17 1217 Oral     SpO2 09/20/17 1217 98 %     Weight --      Height --      Head Circumference --      Peak Flow --      Pain Score 09/20/17 1214 7     Pain Loc --      Pain Edu? --      Excl. in [place]? --    No data found.  Updated Vital Signs BP 106/72 (BP Location: Right Arm)   Pulse 71   Temp 98.1 F (36.7 C) (Oral)   LMP 01/15/2013   SpO2 98%   Physical Exam  Constitutional: She appears well-developed and well-nourished.  Non-toxic appearance.  HENT:  Head: Normocephalic and atraumatic.  Cardiovascular: Normal rate, regular rhythm and normal heart sounds.  Pulmonary/Chest: Effort normal and breath sounds normal. No  respiratory distress.  Abdominal: Soft. Normal appearance and bowel sounds are normal. There is tenderness (generalized tenderness present).  Skin: Skin  is warm and dry.  Nursing note and vitals reviewed.   UC Treatments / Results  Labs (all labs ordered are listed, but only abnormal results are displayed) Labs Reviewed  GLUCOSE, CAPILLARY - Abnormal; Notable for the following components:      Result Value   Glucose-Capillary 195 (*)    All other components within normal limits  POCT URINALYSIS DIP (DEVICE) - Abnormal; Notable for the following components:   Glucose, UA 100 (*)    Bilirubin Urine SMALL (*)    All other components within normal limits  URINE CULTURE    EKG None Radiology No results found.  Procedures Procedures (including critical care time)  Medications Ordered in UC Medications - No data to display   Initial Impression / Assessment and Plan / UC Course  I have reviewed the triage vital signs and the nursing notes.  Pertinent labs & imaging results that were available during my care of the patient were reviewed by me and considered in my medical decision making (see chart for details).  Final Clinical Impressions(s) / UC Diagnoses   Final diagnoses:  Abdominal pain, unspecified abdominal location   UA negative for nitrite and leukocytes but positive for glucose. She does have history of T2DM, her POCT is in room. I highly doubt UTI but culture is pending. Believes her abdominal pain is most likely viral. Don't think Abd 2 views would be beneficial for her today.  There was no red flags noted on exam. Patient is believed to be safe to go home. Informed to rest, encouraged hydration and informed to f/u with pediatrician for no improvement and go to ER for worsening symptoms.  ED Discharge Orders    None     Controlled Substance Prescriptions [place] Controlled Substance Registry consulted? Not Applicable   Barry Dienes, NP 09/20/17 1309

## 2017-09-20 NOTE — ED Triage Notes (Signed)
Pt reports severe intermittent cramps in her central abdomen for two days.  Pt report diarrhea, but she states she takes Metformin and has chronic diarrhea issues anyway.

## 2017-09-21 LAB — URINE CULTURE

## 2017-10-27 ENCOUNTER — Ambulatory Visit (INDEPENDENT_AMBULATORY_CARE_PROVIDER_SITE_OTHER): Payer: Medicare Other | Admitting: Internal Medicine

## 2017-10-27 ENCOUNTER — Encounter: Payer: Self-pay | Admitting: Internal Medicine

## 2017-10-27 VITALS — BP 102/68 | HR 65 | Temp 98.5°F | Ht 66.0 in | Wt 142.0 lb

## 2017-10-27 DIAGNOSIS — M542 Cervicalgia: Secondary | ICD-10-CM

## 2017-10-27 NOTE — Progress Notes (Signed)
   Subjective:    Patient ID: Shelia Leon, female    DOB: 06-25-1960, 57 y.o.   MRN: [id number]  HPI The patient is a 57 YO female coming in for recurrent neck pain. She is getting nerve pain in her neck and shoulders. She denies injury to it but has been working extra hours in an attempt to better her personal finances. She denies weakness in her arms or numbness in her arms. She has been using her muscle relaxer, nsaids for the pain with some relief. Also has been monitoring sugars and running much better. Last night 109 before bed and 98 this morning when she awoke.   Review of Systems  Constitutional: Negative for activity change, appetite change, chills, fatigue, fever and unexpected weight change.  Respiratory: Negative.   Cardiovascular: Negative.   Gastrointestinal: Negative.   Musculoskeletal: Positive for arthralgias and neck pain. Negative for back pain, gait problem and joint swelling.  Skin: Negative.   Neurological: Negative.       Objective:   Physical Exam  Constitutional: She is oriented to person, place, and time. She appears well-developed and well-nourished.  HENT:  Head: Normocephalic and atraumatic.  Eyes: EOM are normal.  Neck: Normal range of motion.  Cardiovascular: Normal rate and regular rhythm.  Pulmonary/Chest: Effort normal and breath sounds normal. No respiratory distress. She has no wheezes. She has no rales.  Abdominal: Soft.  Musculoskeletal: She exhibits tenderness. She exhibits no edema.  Neurological: She is alert and oriented to person, place, and time. Coordination normal.  Skin: Skin is warm and dry.   Vitals:   10/27/17 0846  BP: 102/68  Pulse: 65  Temp: 98.5 F (36.9 C)  TempSrc: Oral  SpO2: 97%  Weight: 142 lb (64.4 kg)  Height:  (1.676 m)      Assessment & Plan:

## 2017-10-27 NOTE — Patient Instructions (Signed)
Keep up the good work with the sugars.

## 2017-10-27 NOTE — Assessment & Plan Note (Signed)
Referral to neurosurgery for possible injection or PT for the neck pain. She will continue current meds for pain control.

## 2017-11-18 ENCOUNTER — Other Ambulatory Visit: Payer: Self-pay | Admitting: Internal Medicine

## 2017-12-02 ENCOUNTER — Ambulatory Visit: Payer: Medicare Other | Admitting: Internal Medicine

## 2017-12-03 ENCOUNTER — Other Ambulatory Visit: Payer: Self-pay | Admitting: Internal Medicine

## 2017-12-05 ENCOUNTER — Encounter: Payer: Self-pay | Admitting: Podiatry

## 2017-12-05 ENCOUNTER — Ambulatory Visit (INDEPENDENT_AMBULATORY_CARE_PROVIDER_SITE_OTHER): Payer: Medicare Other

## 2017-12-05 ENCOUNTER — Ambulatory Visit: Payer: Medicare Other | Admitting: Podiatry

## 2017-12-05 DIAGNOSIS — M2041 Other hammer toe(s) (acquired), right foot: Secondary | ICD-10-CM

## 2017-12-05 DIAGNOSIS — M779 Enthesopathy, unspecified: Secondary | ICD-10-CM

## 2017-12-05 DIAGNOSIS — M2042 Other hammer toe(s) (acquired), left foot: Secondary | ICD-10-CM | POA: Diagnosis not present

## 2017-12-05 DIAGNOSIS — E1149 Type 2 diabetes mellitus with other diabetic neurological complication: Secondary | ICD-10-CM

## 2017-12-05 DIAGNOSIS — L84 Corns and callosities: Secondary | ICD-10-CM

## 2017-12-05 DIAGNOSIS — E1140 Type 2 diabetes mellitus with diabetic neuropathy, unspecified: Secondary | ICD-10-CM

## 2017-12-05 MED ORDER — TRIAMCINOLONE ACETONIDE 10 MG/ML IJ SUSP
10.0000 mg | Freq: Once | INTRAMUSCULAR | Status: AC
  Administered 2017-12-05: 10 mg

## 2017-12-05 NOTE — Patient Instructions (Signed)
Hammer Toe Hammer toe is a change in the shape (a deformity) of your second, third, or fourth toe. The deformity causes the middle joint of your toe to stay bent. This causes pain, especially when you are wearing shoes. Hammer toe starts gradually. At first, the toe can be straightened. Gradually over time, the deformity becomes stiff and permanent. Early treatments to keep the toe straight may relieve pain. As the deformity becomes stiff and permanent, surgery may be needed to straighten the toe. What are the causes? Hammer toe is caused by abnormal bending of the toe joint that is closest to your foot. It happens gradually over time. This pulls on the muscles and connections (tendons) of the toe joint, making them weak and stiff. It is often related to wearing shoes that are too short or narrow and do not let your toes straighten. What increases the risk? You may be at greater risk for hammer toe if you:  Are female.  Are older.  Wear shoes that are too small.  Wear high-heeled shoes that pinch your toes.  Are a ballet dancer.  Have a second toe that is longer than your big toe (first toe).  Injure your foot or toe.  Have arthritis.  Have a family history of hammer toe.  Have a nerve or muscle disorder.  What are the signs or symptoms? The main symptoms of this condition are pain and deformity of the toe. The pain is worse when wearing shoes, walking, or running. Other symptoms may include:  Corns or calluses over the bent part of the toe or between the toes.  Redness and a burning feeling on the toe.  An open sore that forms on the top of the toe.  Not being able to straighten the toe.  How is this diagnosed? This condition is diagnosed based on your symptoms and a physical exam. During the exam, your health care provider will try to straighten your toe to see how stiff the deformity is. You may also have tests, such as:  A blood test to check for rheumatoid  arthritis.  An X-ray to show how severe the deformity is.  How is this treated? Treatment for this condition will depend on how stiff the deformity is. Surgery is often needed. However, sometimes a hammer toe can be straightened without surgery. Treatments that do not involve surgery include:  Taping the toe into a straightened position.  Using pads and cushions to protect the toe (orthotics).  Wearing shoes that provide enough room for the toes.  Doing toe-stretching exercises at home.  Taking an NSAID to reduce pain and swelling.  If these treatments do not help or the toe cannot be straightened, surgery is the next option. The most common surgeries used to straighten a hammer toe include:  Arthroplasty. In this procedure, part of the joint is removed, and that allows the toe to straighten.  Fusion. In this procedure, cartilage between the two bones of the joint is taken out and the bones are fused together into one longer bone.  Implantation. In this procedure, part of the bone is removed and replaced with an implant to let the toe move again.  Flexor tendon transfer. In this procedure, the tendons that curl the toes down (flexor tendons) are repositioned.  Follow these instructions at home:  Take over-the-counter and prescription medicines only as told by your health care provider.  Do toe straightening and stretching exercises as told by your health care provider.  Keep all   follow-up visits as told by your health care provider. This is important. How is this prevented?  Wear shoes that give your toes enough room and do not cause pain.  Do not wear high-heeled shoes. Contact a health care provider if:  Your pain gets worse.  Your toe becomes red or swollen.  You develop an open sore on your toe. This information is not intended to replace advice given to you by your health care provider. Make sure you discuss any questions you have with your health care  provider. Document Released: 05/28/2000 Document Revised: 12/19/2015 Document Reviewed: 09/24/2015 Elsevier Interactive Patient Education  2018 [employer] Inc[employer]  

## 2017-12-07 NOTE — Progress Notes (Signed)
Subjective:   Patient ID: Shelia Leon, female   DOB: 57 y.o.   MRN: [id number]   HPI Patient presents with numerous problems with one being digital deformity of the lesser digits with keratotic lesions distal second toe bilateral inflammation of the sub-first metatarsal left with fluid buildup and pain upon ambulation.  Patient does have diabetes which has not been in good control but she is trying to get it under better control currently and patient does not smoke and likes to   Review of Systems  All other systems reviewed and are negative.       Objective:  Physical Exam  Constitutional: She appears well-developed and well-nourished.  Cardiovascular: Intact distal pulses.  Pulmonary/Chest: Effort normal.  Musculoskeletal: Normal range of motion.  Neurological: She is alert.  Skin: Skin is warm.  Nursing note and vitals reviewed.   Vascular status was intact with patient found to have diminished neurological with diminished sharp dull vibratory noted bilateral.  Patient's found to have elongated second digits bilateral with distal keratotic lesions of the second toes and is noted to have sub-first metatarsal lesion left with fluid buildup and pain with palpation.  Patient does have long-term diabetes and has been a long-term smoker even though she states she is giving it at the current time and has not had good control     Assessment:  At risk diabetic with structural deformity digital deformities and severe keratotic lesion with inflammation fluid around the sub-first MPJ left     Plan:  H&P condition and x-rays reviewed with patient.  Today injected the plantar capsule of the first MPJ left 3 mg Kenalog 5 mg Xylocaine and then I went ahead and using sharp sterile instrumentation I did debridement of lesions on both feet and applied padding.  Discussed routine care and risk factors she has any of reducing her A1c at the current time  X-rays indicated elongated digits with distal  keratotic tissue formation bilateral

## 2018-01-03 ENCOUNTER — Encounter: Payer: Self-pay | Admitting: Internal Medicine

## 2018-01-03 ENCOUNTER — Ambulatory Visit: Payer: Medicare Other | Admitting: Internal Medicine

## 2018-01-03 VITALS — BP 102/60 | HR 74 | Ht 66.0 in | Wt 141.8 lb

## 2018-01-03 DIAGNOSIS — E1169 Type 2 diabetes mellitus with other specified complication: Secondary | ICD-10-CM | POA: Diagnosis not present

## 2018-01-03 DIAGNOSIS — E785 Hyperlipidemia, unspecified: Secondary | ICD-10-CM | POA: Diagnosis not present

## 2018-01-03 DIAGNOSIS — R252 Cramp and spasm: Secondary | ICD-10-CM

## 2018-01-03 DIAGNOSIS — E119 Type 2 diabetes mellitus without complications: Secondary | ICD-10-CM | POA: Diagnosis not present

## 2018-01-03 LAB — POCT GLYCOSYLATED HEMOGLOBIN (HGB A1C): HEMOGLOBIN A1C: 7 % — AB (ref 4.0–5.6)

## 2018-01-03 MED ORDER — FREESTYLE LIBRE 14 DAY READER DEVI: 1.0000 | Freq: Once | 1 refills | Status: AC

## 2018-01-03 MED ORDER — FREESTYLE LIBRE 14 DAY SENSOR MISC: 1.0000 | 11 refills | Status: AC

## 2018-01-03 NOTE — Progress Notes (Signed)
Patient ID: Shelia Leon, female   DOB: [birth year], 57 y.o.   MRN: [id number]   HPI: Shelia Leon is a 57 y.o.-year-old female, returning for follow-up for DM2, dx in ~2000, non-insulin-dependent, uncontrolled, without long term complications.  Last visit 4 months ago.  After her HbA1c returned very high in 02/2017, she changed her diet (more veggies, less coffee, less cigarettes), also started walking.  Her sugars significantly decreased since then but she had another very high HbA1c 4 months ago, although, at that time, sugars at home were at or close to goal...  They are still at or closed to goal now.  Last hemoglobin A1c was: Lab Results  Component Value Date   HGBA1C 11.4 (H) 08/31/2017   HGBA1C 7.9 06/02/2017   HGBA1C 11.2 (H) 03/10/2017  08/23/2017: HbA1c 9.0% at home  Pt is on a regimen of: - Metformin 1000 mg 2x a day with meals She was also on Januvia 100 mg in am >> stopped  b/c $. She was on Glipizide >> low CBGs.  Pt checks her sugars 1-2x a day: - am: 125-130 >> 89-150 >> 80-100 - 2h after b'fast: n/c >> 150-160 - before lunch: n/c >> 69-70's 2x a month >> 140-145 - 2h after lunch: n/c - before dinner: n/c >> 140 >> 180 x1 - 2h after dinner: 120-135 >> 160 >> 140-150 - bedtime: n/c - nighttime: n/c Lowest sugar was 62 (not since stopping Glipizide) >> 89 >> 80s; she has hypoglycemia awareness in the 70s. Highest sugar was 300s >> 500 - but meter broken >> 180  Glucometer: OneTouch Ultra  Pt's meals are: - Breakfast: b'fast bar; coffee; wheat toast + cheerios + regular milk - Lunch: veggies + banana + water - Dinner: brown rice + occas. Steak or fish or chicken + veggies; tacos - Snacks: cup cakes - diabetic No sodas, drinks diluted ice tea  -No CKD, last BUN/creatinine:  Lab Results  Component Value Date   BUN 10 03/10/2017   BUN 8 06/10/2016   CREATININE 0.82 03/10/2017   CREATININE 0.85 06/10/2016  On lisinopril 5 - + HL;  last set of lipids: Lab  Results  Component Value Date   CHOL 118 03/10/2017   HDL 42.60 03/10/2017   LDLCALC 54 03/10/2017   TRIG 108.0 03/10/2017   CHOLHDL 3 03/10/2017  On Lipitor 40. - last eye exam was in 2017: No DR, + low-grade cataract - + numbness and tingling in her feet - whole foot. Has cramps in legs. She takes a B12 vitamin.  At last visit, I suggested alpha lipoic acid >> she started but did not help.  Pt has FH of DM in daughter, 3x brothers.  Is a history of hypothyroidism, on levothyroxine 125 mcg daily.  Latest TSH was reviewed and this was normal: Lab Results  Component Value Date   TSH 0.66 03/10/2017   ROS: Constitutional: no weight gain/no weight loss, + fatigue, no subjective hyperthermia, no subjective hypothermia Eyes: no blurry vision, no xerophthalmia ENT: no sore throat, no nodules palpated in throat, no dysphagia, no odynophagia, no hoarseness Cardiovascular: no CP/no SOB/no palpitations/no leg swelling Respiratory: no cough/no SOB/no wheezing Gastrointestinal: no N/no V/no D/no C/no acid reflux Musculoskeletal: no muscle aches/no joint aches Skin: no rashes, no hair loss Neurological: no tremors/+ numbness/+ tingling/no dizziness  I reviewed pt's medications, allergies, PMH, social hx, family hx, and changes were documented in the history of present illness. Otherwise, unchanged from my initial visit note.  Past Medical  History:  Diagnosis Date  . Anemia   . Arthritis   . Diabetes mellitus without complication ([place])   . Hyperlipidemia   . Osteopenia 02/2017   T score -2.4 FRAX 2.3%/0.2%  . Thyroid disease    Past Surgical History:  Procedure Laterality Date  . BUNIONECTOMY Right 1987  . CERVICAL SPINE SURGERY     hardware   Social History   Socioeconomic History  . Marital status: Married    Spouse name: Not on file  . Number of children: Not on file  . Years of education: Not on file  . Highest education level: Not on file  Occupational History  . Not on  file  Social Needs  . Financial resource strain: Not on file  . Food insecurity:    Worry: Not on file    Inability: Not on file  . Transportation needs:    Medical: Not on file    Non-medical: Not on file  Tobacco Use  . Smoking status: Current Every Day Smoker    Packs/day: 0.40    Years: 34.00    Pack years: 13.60    Types: Cigarettes  . Smokeless tobacco: Never Used  . Tobacco comment: 5-7 cigs a day   Substance and Sexual Activity  . Alcohol use: No  . Drug use: No  . Sexual activity: Yes    Birth control/protection: Post-menopausal    Comment: 1st intercourse- 8, partners - 6  Lifestyle  . Physical activity:    Days per week: Not on file    Minutes per session: Not on file  . Stress: Not on file  Relationships  . Social connections:    Talks on phone: Not on file    Gets together: Not on file    Attends religious service: Not on file    Active member of club or organization: Not on file    Attends meetings of clubs or organizations: Not on file    Relationship status: Not on file  . Intimate partner violence:    Fear of current or ex partner: Not on file    Emotionally abused: Not on file    Physically abused: Not on file    Forced sexual activity: Not on file  Other Topics Concern  . Not on file  Social History Narrative   Lives with husband in a 2 story house.  She had two grown children.   She previously worked as Quarry manager, stopped working in 2011.  She has been on disability since then.      States that it is very painful going up and down stairs.    Sleeps upstairs so she only makes one trip a day up at bedtime.      High school education.   Current Outpatient Medications on File Prior to Visit  Medication Sig Dispense Refill  . Alcohol Swabs (ALCOHOL PREPS) PADS by Does not apply route.    Marland Kitchen atorvastatin (LIPITOR) 40 MG tablet TAKE 1 TABLET BY MOUTH AT  BEDTIME 90 tablet 1  . Blood Glucose Monitoring Suppl (ONE TOUCH ULTRA 2) w/Device KIT USE TO CHECK  BLOOD SUGARS  EVERY DAY 1 each 0  . Cholecalciferol (VITAMIN D3) 2000 UNITS TABS Take 2,000 Units by mouth daily.    . cyanocobalamin 1000 MCG tablet Take 100 mcg by mouth daily.    Marland Kitchen dicyclomine (BENTYL) 10 MG capsule TAKE 1 CAPSULE BY MOUTH 4  TIMES DAILY - BEFORE MEALS  AND AT BEDTIME 360 capsule 1  .  docusate sodium (COLACE) 100 MG capsule Take 100 mg by mouth 2 (two) times daily.    Marland Kitchen doxycycline (VIBRA-TABS) 100 MG tablet Take 1 tablet (100 mg total) by mouth 2 (two) times daily. 20 tablet 0  . glucose blood (ONE TOUCH ULTRA TEST) test strip 1 each by Other route 3 (three) times daily. Use to check blood sugars three times a day Dx E11.9 100 each 3  . IBUPROFEN PO Take by mouth.    . levothyroxine (SYNTHROID, LEVOTHROID) 125 MCG tablet TAKE 1 TABLET BY MOUTH  DAILY BEFORE BREAKFAST 90 tablet 1  . lisinopril (PRINIVIL,ZESTRIL) 5 MG tablet TAKE 1 TABLET BY MOUTH  DAILY 90 tablet 1  . metFORMIN (GLUCOPHAGE) 1000 MG tablet TAKE 1 TABLET BY MOUTH TWO  TIMES DAILY WITH MEALS 180 tablet 1  . naproxen (NAPROSYN) 500 MG tablet TAKE 1 TABLET BY MOUTH  DAILY WITH LUNCH 90 tablet 1  . nortriptyline (PAMELOR) 10 MG capsule TAKE 3 CAPSULES BY MOUTH AT BEDTIME 90 capsule 2  . ONETOUCH DELICA LANCETS 18E MISC USE AS DIRECTED TO CHECK  BLOOD SUGARS 3 TIMES DAILY 300 each 0  . tiZANidine (ZANAFLEX) 2 MG tablet TAKE 1 TO 2 TABLETS BY  MOUTH 3 TIMES DAILY AS  NEEDED FOR MUSCLE SPASMS 180 tablet 1   No current facility-administered medications on file prior to visit.    Allergies  Allergen Reactions  . Penicillins    Family History  Problem Relation Age of [place]  . Alcohol abuse Mother   . Arthritis Mother   . Heart disease Mother   . Alcohol abuse Father   . Arthritis Father   . Heart disease Father   . Diabetes Daughter   . Heart disease Sister   . Diabetes Brother   . Diabetes Brother   . Diabetes Brother   . Diabetes Brother   . Heart disease Brother     PE: BP 102/60   Pulse 74   Ht  _0  (1.676 m)   Wt 141 lb 12.8 oz (64.3 kg)   LMP 01/15/2013   SpO2 96%   BMI 22.89 kg/m  Wt Readings from Last 3 Encounters:  01/03/18 141 lb 12.8 oz (64.3 kg)  10/27/17 142 lb (64.4 kg)  08/31/17 142 lb 6.4 oz (64.6 kg)   Constitutional: normal weight, in NAD Eyes: PERRLA, EOMI, no exophthalmos ENT: moist mucous membranes, no thyromegaly, no cervical lymphadenopathy Cardiovascular: RRR, No MRG Respiratory: CTA B Gastrointestinal: abdomen soft, NT, ND, BS+ Musculoskeletal: no deformities, strength intact in all 4 Skin: moist, warm, no rashes Neurological: no tremor with outstretched hands, DTR normal in all 4  ASSESSMENT: 1. DM2, non-insulin-dependent, uncontrolled, without long term complications  2. HL  3.  Muscle cramps  PLAN:  1. Patient with long-standing, uncontrolled, diabetes, on oral antidiabetic regimen with only metformin as she could not afford Januvia anymore before last visit.  Her sugars did not change significantly after stopping Januvia as she also started to make dietary changes and increasing activity.  Her sugars were fluctuating at last visit and she believed that this was due to a problem with her meter, but overall, the sugars were at or close to goal so we did not make any changes in her regimen. - at this visit, sugars are at or very close to goal >> discussed to  Bring sugar log or meter at next visit as she cannot remember the CBGs well, but based on the recalled values, we do not need  to change his medication - I suggested to:  Patient Instructions  Please continue: - Metformin 1000 mg 2x a day with meals  Please return in 4 months with your sugar log.   - today, HbA1c is 7.0% (much better) - continue checking sugars at different times of the day - check 1x a day, rotating checks - advised for yearly eye exams >> she is not UTD - Return to clinic in 4 mo with sugar log    2. HL - Reviewed latest lipid panel from 02/2017: LDL at goal Lab  Results  Component Value Date   CHOL 118 03/10/2017   HDL 42.60 03/10/2017   LDLCALC 54 03/10/2017   TRIG 108.0 03/10/2017   CHOLHDL 3 03/10/2017  - Continues Lipitor without side effects.  3.  Muscle cramps -Last visit, I suggested a magnesium supplement and also: - Muscle Calm spray ([employer] - 5$) - Theraworx cream ([place] - 20$) - she used these but they did not help  - however, the cramps improved since last visit  Philemon Kingdom, MD PhD [facility]

## 2018-01-03 NOTE — Addendum Note (Signed)
Addended by: Yolande JollyLAWSON, Iley Breeden on: 01/03/2018 04:49 PM   Modules accepted: Orders

## 2018-01-03 NOTE — Patient Instructions (Signed)
Patient Instructions  Please continue: - Metformin 1000 mg 2x a day with meals  Please return in 4 months with your sugar log.

## 2018-01-26 ENCOUNTER — Encounter: Payer: Self-pay | Admitting: Obstetrics & Gynecology

## 2018-01-26 ENCOUNTER — Telehealth: Payer: Self-pay

## 2018-01-26 DIAGNOSIS — Z01811 Encounter for preprocedural respiratory examination: Secondary | ICD-10-CM

## 2018-01-26 DIAGNOSIS — Z01818 Encounter for other preprocedural examination: Secondary | ICD-10-CM

## 2018-01-26 NOTE — Telephone Encounter (Signed)
Called patient back in regard to there message below, patient stated that she has hammer toes causing a lot of pain and podiatrist needs to know if circulation in legs are okay before they can do the procedure on the hammer toes. So she needs a referral to cardiology so she can be cleared for procedure   Copied from CRM [id number]. Topic: Referral - Request >> Jan 25, 2018  4:27 PM Arlyss Gandyichardson, Taren N, NT wrote: Reason for CRM: Pt needing a referral to a cardiologist so that she can be cleared with the podiatrist.

## 2018-01-26 NOTE — Telephone Encounter (Deleted)
Copied from CRM #[id number]. Topic: Referral - Request >> Jan 25, 2018  4:27 PM Richardson, Taren N, NT wrote: Reason for CRM: Pt needing a referral to a cardiologist so that she can be cleared with the podiatrist. 

## 2018-01-26 NOTE — Telephone Encounter (Signed)
Copied from CRM [phone]. Topic: Referral - Request >> Jan 25, 2018  4:27 PM Arlyss Gandyichardson, Taren N, NT wrote: Reason for CRM: Pt needing a referral to a cardiologist so that she can be cleared with the podiatrist.

## 2018-01-27 ENCOUNTER — Other Ambulatory Visit: Payer: Self-pay

## 2018-01-27 ENCOUNTER — Ambulatory Visit: Payer: Medicare Other | Admitting: Internal Medicine

## 2018-01-27 MED ORDER — LISINOPRIL 5 MG PO TABS: 5.0000 mg | ORAL_TABLET | Freq: Every day | ORAL | 1 refills | Status: DC

## 2018-01-27 MED ORDER — METFORMIN HCL 1000 MG PO TABS: 1000.0000 mg | ORAL_TABLET | Freq: Two times a day (BID) | ORAL | 1 refills | Status: DC

## 2018-01-27 MED ORDER — NAPROXEN 500 MG PO TABS: 500.0000 mg | ORAL_TABLET | Freq: Every day | ORAL | 1 refills | Status: DC

## 2018-01-27 MED ORDER — ATORVASTATIN CALCIUM 40 MG PO TABS: 40.0000 mg | ORAL_TABLET | Freq: Every day | ORAL | 1 refills | Status: DC

## 2018-01-27 MED ORDER — LEVOTHYROXINE SODIUM 125 MCG PO TABS: 125.0000 ug | ORAL_TABLET | Freq: Every day | ORAL | 1 refills | Status: DC

## 2018-01-27 MED ORDER — DICYCLOMINE HCL 10 MG PO CAPS: ORAL_CAPSULE | ORAL | 1 refills | Status: AC

## 2018-01-27 MED ORDER — TIZANIDINE HCL 2 MG PO TABS: ORAL_TABLET | ORAL | 1 refills | Status: DC

## 2018-01-27 NOTE — Telephone Encounter (Signed)
Referral placed for cardiology

## 2018-01-27 NOTE — Telephone Encounter (Signed)
LVM informing patient referral was placed

## 2018-02-01 ENCOUNTER — Encounter: Payer: Self-pay | Admitting: Internal Medicine

## 2018-02-21 ENCOUNTER — Encounter: Payer: Self-pay | Admitting: Internal Medicine

## 2018-02-21 ENCOUNTER — Other Ambulatory Visit (INDEPENDENT_AMBULATORY_CARE_PROVIDER_SITE_OTHER): Payer: Medicare Other

## 2018-02-21 ENCOUNTER — Ambulatory Visit (INDEPENDENT_AMBULATORY_CARE_PROVIDER_SITE_OTHER): Payer: Medicare Other | Admitting: Internal Medicine

## 2018-02-21 VITALS — BP 110/70 | HR 65 | Temp 98.5°F | Ht 66.0 in | Wt 143.0 lb

## 2018-02-21 DIAGNOSIS — E559 Vitamin D deficiency, unspecified: Secondary | ICD-10-CM

## 2018-02-21 DIAGNOSIS — E538 Deficiency of other specified B group vitamins: Secondary | ICD-10-CM | POA: Diagnosis not present

## 2018-02-21 DIAGNOSIS — Z23 Encounter for immunization: Secondary | ICD-10-CM

## 2018-02-21 DIAGNOSIS — E039 Hypothyroidism, unspecified: Secondary | ICD-10-CM

## 2018-02-21 LAB — COMPREHENSIVE METABOLIC PANEL
ALT: 17 U/L (ref 0–35)
AST: 15 U/L (ref 0–37)
Albumin: 4.4 g/dL (ref 3.5–5.2)
Alkaline Phosphatase: 65 U/L (ref 39–117)
BUN: 9 mg/dL (ref 6–23)
CALCIUM: 9.5 mg/dL (ref 8.4–10.5)
CHLORIDE: 107 meq/L (ref 96–112)
CO2: 26 meq/L (ref 19–32)
Creatinine, Ser: 0.76 mg/dL (ref 0.40–1.20)
GFR: 84.03 mL/min (ref >60.00)
GLUCOSE: 136 mg/dL — AB (ref 70–99)
Potassium: 4.9 mEq/L (ref 3.5–5.1)
Sodium: 141 mEq/L (ref 135–145)
Total Bilirubin: 0.4 mg/dL (ref 0.2–1.2)
Total Protein: 7 g/dL (ref 6.0–8.3)

## 2018-02-21 LAB — LIPID PANEL
CHOLESTEROL: 124 mg/dL (ref 0–200)
HDL: 53.5 mg/dL (ref >39.00)
LDL Cholesterol: 50 mg/dL (ref 0–99)
NONHDL: 70.77
Total CHOL/HDL Ratio: 2
Triglycerides: 106 mg/dL (ref 0.0–149.0)
VLDL: 21.2 mg/dL (ref 0.0–40.0)

## 2018-02-21 LAB — CBC
HEMATOCRIT: 36.5 % (ref 36.0–46.0)
HEMOGLOBIN: 12.6 g/dL (ref 12.0–15.0)
MCHC: 34.4 g/dL (ref 30.0–36.0)
MCV: 94.1 fl (ref 78.0–100.0)
PLATELETS: 194 10*3/uL (ref 150.0–400.0)
RBC: 3.88 Mil/uL (ref 3.87–5.11)
RDW: 12.6 % (ref 11.5–15.5)
WBC: 9.7 10*3/uL (ref 4.0–10.5)

## 2018-02-21 LAB — T4, FREE: Free T4: 0.86 ng/dL (ref 0.60–1.60)

## 2018-02-21 LAB — VITAMIN D 25 HYDROXY (VIT D DEFICIENCY, FRACTURES): VITD: 65.21 ng/mL (ref 30.00–100.00)

## 2018-02-21 LAB — TSH: TSH: 0.73 u[IU]/mL (ref 0.35–4.50)

## 2018-02-21 LAB — VITAMIN B12: VITAMIN B 12: 386 pg/mL (ref 211–911)

## 2018-02-21 NOTE — Assessment & Plan Note (Signed)
Checking vitamin D level, adjust as needed. Taking otc vitamin D when she can recall.

## 2018-02-21 NOTE — Assessment & Plan Note (Signed)
Taking otc vitamin B12 100 mg daily. Checking B12 level and adjust as needed.

## 2018-02-21 NOTE — Assessment & Plan Note (Signed)
Having some symptoms which could indicate low levels. Checking TSH and free T4 and adjust synthroid 125 mcg daily.

## 2018-02-21 NOTE — Progress Notes (Signed)
   Subjective:    Patient ID: Shelia Leon, female    DOB: April 27, 1961, 57 y.o.   MRN: [id number]  HPI The patient is a 57 YO female coming in for follow up of thyroid (having some hair thinning which is new in the last several months, denies heat or cold intolerance, denies fatigue) and b12 deficiency (taking oral B12 over the counter, denies numbness or tingling, denies fatigue, likely due to metformin which she is still taking) and vitamin D deficiency (feeling some hair loss, denies fatigue, denies nail changes, taking over the counter when she remembers).   Review of Systems  Constitutional: Negative.   HENT: Negative.        Hair thinning  Eyes: Negative.   Respiratory: Negative for cough, chest tightness and shortness of breath.   Cardiovascular: Negative for chest pain, palpitations and leg swelling.  Gastrointestinal: Negative for abdominal distention, abdominal pain, constipation, diarrhea, nausea and vomiting.  Musculoskeletal: Negative.   Skin: Negative.   Neurological: Negative.   Psychiatric/Behavioral: Negative.       Objective:   Physical Exam  Constitutional: She is oriented to person, place, and time. She appears well-developed and well-nourished.  HENT:  Head: Normocephalic and atraumatic.  Eyes: EOM are normal.  Neck: Normal range of motion.  Cardiovascular: Normal rate and regular rhythm.  Pulmonary/Chest: Effort normal and breath sounds normal. No respiratory distress. She has no wheezes. She has no rales.  Abdominal: Soft. Bowel sounds are normal. She exhibits no distension. There is no tenderness. There is no rebound.  Musculoskeletal: She exhibits no edema.  Neurological: She is alert and oriented to person, place, and time. Coordination normal.  Skin: Skin is warm and dry.  Psychiatric: She has a normal mood and affect.   Vitals:   02/21/18 0757  BP: 110/70  Pulse: 65  Temp: 98.5 F (36.9 C)  TempSrc: Oral  SpO2: 97%  Weight: 143 lb (64.9 kg)    Height: 5\' 6"  (1.676 m)      Assessment & Plan:  Flu shot given at visit

## 2018-02-21 NOTE — Patient Instructions (Signed)
We will check the labs today and call you back about the results.    

## 2018-02-21 NOTE — Addendum Note (Signed)
Addended by: Berton Lan R on: 02/21/2018 08:26 AM   Modules accepted: Orders

## 2018-02-23 ENCOUNTER — Encounter: Payer: Self-pay | Admitting: Internal Medicine

## 2018-03-10 ENCOUNTER — Ambulatory Visit: Payer: Medicare Other | Admitting: Cardiovascular Disease

## 2018-03-12 ENCOUNTER — Other Ambulatory Visit: Payer: Self-pay | Admitting: Internal Medicine

## 2018-03-21 ENCOUNTER — Encounter: Payer: Self-pay | Admitting: Cardiovascular Disease

## 2018-03-21 ENCOUNTER — Ambulatory Visit: Payer: Medicare Other | Admitting: Cardiovascular Disease

## 2018-03-21 VITALS — BP 107/71 | HR 72 | Ht 65.5 in | Wt 146.4 lb

## 2018-03-21 DIAGNOSIS — Z01818 Encounter for other preprocedural examination: Secondary | ICD-10-CM | POA: Diagnosis not present

## 2018-03-21 DIAGNOSIS — Z8249 Family history of ischemic heart disease and other diseases of the circulatory system: Secondary | ICD-10-CM

## 2018-03-21 DIAGNOSIS — I739 Peripheral vascular disease, unspecified: Secondary | ICD-10-CM

## 2018-03-21 DIAGNOSIS — E119 Type 2 diabetes mellitus without complications: Secondary | ICD-10-CM

## 2018-03-21 NOTE — Patient Instructions (Signed)
Medication Instructions:  Your physician recommends that you continue on your current medications as directed. Please refer to the Current Medication list given to you today.  If you need a refill on your cardiac medications before your next appointment, please call your pharmacy.   Lab work: NONE  Testing/Procedures: Your physician has requested that you have an ankle brachial index (ABI). During this test an ultrasound and blood pressure cuff are used to evaluate the arteries that supply the arms and legs with blood. Allow thirty minutes for this exam. There are no restrictions or special instructions.  Your physician has requested that you have a lower or upper extremity arterial duplex. This test is an ultrasound of the arteries in the legs or arms. It looks at arterial blood flow in the legs and arms. Allow one hour for Lower and Upper Arterial scans. There are no restrictions or special instructions  Your physician has requested that you have a lexiscan myoview. For further information please visit [email or web address] Please follow instruction sheet, as given.  Follow-Up: At [facility], you and your health needs are our priority.  As part of our continuing mission to provide you with exceptional heart care, we have created designated Provider Care Teams.  These Care Teams include your primary Cardiologist (physician) and Advanced Practice Providers (APPs -  Physician Assistants and Nurse Practitioners) who all work together to provide you with the care you need, when you need it. You will need a follow up appointment in 12 months.  Please call our office 2 months in advance to schedule this appointment.  You may see Chilton Si, MD or one of the following Advanced Practice Providers on your designated Care Team:   Corine Shelter, PA-C Judy Pimple, [place] . Marjie Skiff, PA-C  Any Other Special Instructions Will Be Listed Below (If Applicable).  Ankle-Brachial Index Test The  ankle-brachial index (ABI) test is used to find peripheral vascular disease (PVD). PVD is also known as peripheral arterial disease (PAD). PVD is the blocking or hardening of the arteries anywhere within the circulatory system beyond the heart. PVD is caused by cholesterol deposits in your blood vessels (atherosclerosis). These deposits cause arteries to narrow. The delivery of oxygen to your tissues is impaired as a result. This can cause muscle pain and fatigue. This is called claudication. PVD means there may also be buildup of cholesterol in your:  Heart. This increases the risk of heart attacks.  Brain. This increases the risk of strokes.  The ankle-brachial index test measures the blood flow in your arms and legs. This test also determines if blood vessels in your leg are narrowed by cholesterol deposits. There are additional causes of a reduced ankle-brachial index, such as inflammation of vessels or a clot in the vessels. However, these are much less common than narrowing due to cholesterol deposits. What is being tested? The test is done while you are lying down and resting. Measurements are taken of the systolic pressure:  In your arm (brachial).  In your ankle at several points along your leg.  Systolic pressure is the pressure inside your arteries when your heart pumps. The measurements are taken several times on both sides. Then, the highest systolic pressure of the ankle is divided by the highest brachial systolic pressure. The result is the ankle-brachial pressure ratio, or ABI. Sometimes this test is repeated after you have exercised on a treadmill for five minutes. You may have leg pain during the exercise portion of the test if you  suffer from PAD. If the index number drops after exercise, this may show that PAD is present. A normal ABI ratio is between 0.9 and 1.4. A value below 0.9 is considered abnormal. This information is not intended to replace advice given to you by your  health care provider. Make sure you discuss any questions you have with your health care provider. Document Released: 06/04/2004 Document Revised: 11/06/2015 Document Reviewed: 01/04/2014 Elsevier Interactive Patient Education  2018 [employer].  Cardiac Nuclear Scan A cardiac nuclear scan is a test that measures blood flow to the heart when a person is resting and when he or she is exercising. The test looks for problems such as:  Not enough blood reaching a portion of the heart.  The heart muscle not working normally.  You may need this test if:  You have heart disease.  You have had abnormal lab results.  You have had heart surgery or angioplasty.  You have chest pain.  You have shortness of breath.  In this test, a radioactive dye (tracer) is injected into your bloodstream. After the tracer has traveled to your heart, an imaging device is used to measure how much of the tracer is absorbed by or distributed to various areas of your heart. This procedure is usually done at a hospital and takes 2-4 hours. Tell a health care provider about:  Any allergies you have.  All medicines you are taking, including vitamins, herbs, eye drops, creams, and over-the-counter medicines.  Any problems you or family members have had with the use of anesthetic medicines.  Any blood disorders you have.  Any surgeries you have had.  Any medical conditions you have.  Whether you are pregnant or may be pregnant. What are the risks? Generally, this is a safe procedure. However, problems may occur, including:  Serious chest pain and heart attack. This is only a risk if the stress portion of the test is done.  Rapid heartbeat.  Sensation of warmth in your chest. This usually passes quickly.  What happens before the procedure?  Ask your health care provider about changing or stopping your regular medicines. This is especially important if you are taking diabetes medicines or blood  thinners.  Remove your jewelry on the day of the procedure. What happens during the procedure?  An IV tube will be inserted into one of your veins.  Your health care provider will inject a small amount of radioactive tracer through the tube.  You will wait for 20-40 minutes while the tracer travels through your bloodstream.  Your heart activity will be monitored with an electrocardiogram (ECG).  You will lie down on an exam table.  Images of your heart will be taken for about 15-20 minutes.  You may be asked to exercise on a treadmill or stationary bike. While you exercise, your heart's activity will be monitored with an ECG, and your blood pressure will be checked. If you are unable to exercise, you may be given a medicine to increase blood flow to parts of your heart.  When blood flow to your heart has peaked, a tracer will again be injected through the IV tube.  After 20-40 minutes, you will get back on the exam table and have more images taken of your heart.  When the procedure is over, your IV tube will be removed. The procedure may vary among health care providers and hospitals. Depending on the type of tracer used, scans may need to be repeated 3-4 hours later. What happens  after the procedure?  Unless your health care provider tells you otherwise, you may return to your normal schedule, including diet, activities, and medicines.  Unless your health care provider tells you otherwise, you may increase your fluid intake. This will help flush the contrast dye from your body. Drink enough fluid to keep your urine clear or pale yellow.  It is up to you to get your test results. Ask your health care provider, or the department that is doing the test, when your results will be ready. Summary  A cardiac nuclear scan measures the blood flow to the heart when a person is resting and when he or she is exercising.  You may need this test if you are at risk for heart disease.  Tell  your health care provider if you are pregnant.  Unless your health care provider tells you otherwise, increase your fluid intake. This will help flush the contrast dye from your body. Drink enough fluid to keep your urine clear or pale yellow. This information is not intended to replace advice given to you by your health care provider. Make sure you discuss any questions you have with your health care provider. Document Released: 06/25/2004 Document Revised: 06/02/2016 Document Reviewed: 05/09/2013 Elsevier Interactive Patient Education  2017 [employer].

## 2018-03-21 NOTE — Addendum Note (Signed)
Addended by: Regis Bill B on: 03/21/2018 10:49 AM   Modules accepted: Orders

## 2018-03-21 NOTE — Progress Notes (Signed)
  Cardiology Office Note   Date:  03/21/2018   ID:  Shelia Leon, DOB 03/24/1961, MRN [id number]  PCP:  Leon, Shelia A, MD  Cardiologist:   Tiffany Stewart, MD   Chief Complaint  Patient presents with  . New Patient (Initial Visit)      History of Present Illness: Shelia Leon is a 56 y.o. female with diabetes and tobacco abuse who is being seen today for the evaluation of pre-surgical risk assessment at the request of Leon, Shelia A, *.  Shelia Leon has been seeing a podiatrist and is scheduled to have surgery for hammer toes.  They requested that she be cleared by cardiology first to assess her LE circulation.  She was diagnosed with diabetes around 1998.  It has been much better controlled lately but A1c has been as high as 11.  She has peripheral neuropathy.  Her functional ability has been very limited by her hammertoes.  She is unable to exercise due to this.  She has no chest pain or shortness of breath.  She denies lower extremity edema, orthopnea, or PND.  She works as a cashier part-time.  Walking to the bathroom is difficult because it is all the way in the back of the store and she has a lot of pain in her feet.  However her breathing and chest discomfort do not limit her ability to walk.  She is able to walk up a flight of steps.  She was diagnosed with  Shelia Leon has a very strong family history of premature CAD.  Her father died of heart attack at age 55.  She has several brothers and a sister who had early heart disease.  She is been smoking for 25 years and has cut back to one quarter of a pack daily.  She has been able to quit in the past by slowly cutting back.  She always starts back smoking once she feels stressed.  Past Medical History:  Diagnosis Date  . Anemia   . Arthritis   . Diabetes mellitus without complication (HCC)   . Hyperlipidemia   . Osteopenia 02/2017   T score -2.4 FRAX 2.3%/0.2%  . Thyroid disease     Past Surgical History:    Procedure Laterality Date  . BUNIONECTOMY Right 1987  . CERVICAL SPINE SURGERY     hardware     Current Outpatient Medications  Medication Sig Dispense Refill  . Alcohol Swabs (ALCOHOL PREPS) PADS by Does not apply route.    . aspirin EC 81 MG tablet Take 81 mg by mouth daily.    . atorvastatin (LIPITOR) 40 MG tablet Take 1 tablet (40 mg total) by mouth at bedtime. 90 tablet 1  . Blood Glucose Monitoring Suppl (ONE TOUCH ULTRA 2) w/Device KIT USE TO CHECK BLOOD SUGARS  EVERY DAY 1 each 0  . Cholecalciferol (VITAMIN D3) 2000 UNITS TABS Take 2,000 Units by mouth daily.    . Continuous Blood Gluc Sensor (FREESTYLE LIBRE 14 DAY SENSOR) MISC 1 each by Does not apply route every 14 (fourteen) days. Change every 2 weeks 2 each 11  . cyanocobalamin 1000 MCG tablet Take 100 mcg by mouth daily.    . dicyclomine (BENTYL) 10 MG capsule Take 1 capsule by mouth 4 times daily before meals and at bedtime 360 capsule 1  . docusate sodium (COLACE) 100 MG capsule Take 100 mg by mouth 2 (two) times daily.    . glucose blood (ONE TOUCH ULTRA TEST) test strip 1   each by Other route 3 (three) times daily. Use to check blood sugars three times a day Dx E11.9 100 each 3  . IBUPROFEN PO Take by mouth.    . Lancets (ONETOUCH DELICA PLUS KDXIPJ82N) MISC USE AS DIRECTED TO CHECK  BLOOD SUGARS 3 TIMES DAILY 300 each 1  . levothyroxine (SYNTHROID, LEVOTHROID) 125 MCG tablet Take 1 tablet (125 mcg total) by mouth daily before breakfast. 90 tablet 1  . lisinopril (PRINIVIL,ZESTRIL) 5 MG tablet Take 1 tablet (5 mg total) by mouth daily. 90 tablet 1  . metFORMIN (GLUCOPHAGE) 1000 MG tablet Take 1 tablet (1,000 mg total) by mouth 2 (two) times daily with a meal. 180 tablet 1  . naproxen (NAPROSYN) 500 MG tablet Take 1 tablet (500 mg total) by mouth daily. 90 tablet 1  . nortriptyline (PAMELOR) 10 MG capsule TAKE 3 CAPSULES BY MOUTH AT BEDTIME 90 capsule 2  . tiZANidine (ZANAFLEX) 2 MG tablet Take 1 to 2 Tablets by mouth 3  times daily as needed for muscle spasms 180 tablet 1   No current facility-administered medications for this visit.     Allergies:   Penicillins    Social History:  The patient  reports that she has been smoking cigarettes. She has a 13.60 pack-year smoking history. She has never used smokeless tobacco. She reports that she does not drink alcohol or use drugs.   Family History:  The patient's family history includes Alcohol abuse in her father and mother; Arthritis in her father and mother; CAD in her mother; Cancer in her sister; Diabetes in her brother, brother, brother, brother, and daughter; Heart attack in her brother, brother, brother, and sister; Heart disease in her father, mother, and sister; Hypertension in her brother and brother.    ROS:  Please see the history of present illness.   Otherwise, review of systems are positive for none.   All other systems are reviewed and negative.    PHYSICAL EXAM: VS:  BP 107/71   Pulse 72   Ht 5' 5.5" (1.664 m)   Wt 146 lb 6.4 oz (66.4 kg)   LMP 01/15/2013   BMI 23.99 kg/m  , BMI Body mass index is 23.99 kg/m. GENERAL:  Well appearing HEENT:  Pupils equal round and reactive, fundi not visualized, oral mucosa unremarkable NECK:  No jugular venous distention, waveform within normal limits, carotid upstroke brisk and symmetric, no bruits, no thyromegaly LUNGS:  Clear to auscultation bilaterally HEART:  RRR.  PMI not displaced or sustained,S1 and S2 within normal limits, no S3, no S4, no clicks, no rubs, no murmurs ABD:  Flat, positive bowel sounds normal in frequency in pitch, no bruits, no rebound, no guarding, no midline pulsatile mass, no hepatomegaly, no splenomegaly EXT:  1+ DP/TP pulses bilaterally.  No edema, no cyanosis no clubbing SKIN:  No rashes no nodules NEURO:  Cranial nerves II through XII grossly intact, motor grossly intact throughout PSYCH:  Cognitively intact, oriented to person place and time   EKG:  EKG is ordered  today. The ekg ordered today demonstrates sinus rhythm.  Rate 72 bpm.   Recent Labs: 02/21/2018: ALT 17; BUN 9; Creatinine, Ser 0.76; Hemoglobin 12.6; Platelets 194.0; Potassium 4.9; Sodium 141; TSH 0.73    Lipid Panel    Component Value Date/Time   CHOL 124 02/21/2018 0823   CHOL 181 07/20/2012   TRIG 106.0 02/21/2018 0823   TRIG 89 07/20/2012   HDL 53.50 02/21/2018 0823   CHOLHDL 2 02/21/2018 0539  VLDL 21.2 02/21/2018 0823   LDLCALC 50 02/21/2018 0823      Wt Readings from Last 3 Encounters:  03/21/18 146 lb 6.4 oz (66.4 kg)  02/21/18 143 lb (64.9 kg)  01/03/18 141 lb 12.8 oz (64.3 kg)      ASSESSMENT AND PLAN:  # Pre-surgical risk assessment:  Ms. Bermea is able to walk up a flight of steps without chest pain or shortness of breath.  Her functional capacity is limited mostly by her feet and not chest pain.  However she has an extensive family history of premature CAD.  She also has diabetes that has been poorly controlled intermittently for the last 20 years and has peripheral neuropathy.  Therefore it is quite possible that she may have obstructive disease without symptoms.  Prior to her surgery we will get a Lexiscan Myoview to assess for ischemia.  Continue aspirin and rosuvastatin.  She is unable to walk on a treadmill due to foot pain.  # Hyperlipidemia:  # CV Disease Prevention: # Tobacco abuse: LDL 50 02/2018.  Continue rosuvastatin and aspirin.  Encouraged her to start back exercising after she has her foot surgery.  Also encouraged continued smoking cessation.  # Claudication: Ms. Popoca has claudication and LE pulses are diminished.  Will get ABIs.   Current medicines are reviewed at length with the patient today.  The patient does not have concerns regarding medicines.  The following changes have been made:  no change  Labs/ tests ordered today include:   Orders Placed This Encounter  Procedures  . MYOCARDIAL PERFUSION IMAGING     Disposition:   FU  with Azure Budnick C. Oval Linsey, MD, [facility] in 1 year.      Signed, Dawnetta Copenhaver C. Oval Linsey, MD, [facility]  03/21/2018 10:36 AM    Time

## 2018-03-28 ENCOUNTER — Other Ambulatory Visit: Payer: Self-pay

## 2018-03-28 ENCOUNTER — Other Ambulatory Visit: Payer: Self-pay | Admitting: Neurology

## 2018-03-28 ENCOUNTER — Other Ambulatory Visit: Payer: Self-pay | Admitting: Internal Medicine

## 2018-03-28 ENCOUNTER — Other Ambulatory Visit: Payer: Self-pay | Admitting: Cardiovascular Disease

## 2018-03-28 DIAGNOSIS — Z01818 Encounter for other preprocedural examination: Secondary | ICD-10-CM

## 2018-03-28 DIAGNOSIS — I739 Peripheral vascular disease, unspecified: Secondary | ICD-10-CM

## 2018-03-28 DIAGNOSIS — E119 Type 2 diabetes mellitus without complications: Secondary | ICD-10-CM

## 2018-04-06 ENCOUNTER — Telehealth (HOSPITAL_COMMUNITY): Payer: Self-pay

## 2018-04-06 LAB — HM DIABETES EYE EXAM

## 2018-04-06 NOTE — Telephone Encounter (Signed)
Encounter complete. 

## 2018-04-07 ENCOUNTER — Telehealth (HOSPITAL_COMMUNITY): Payer: Self-pay

## 2018-04-07 NOTE — Telephone Encounter (Signed)
Encounter complete. 

## 2018-04-10 ENCOUNTER — Other Ambulatory Visit: Payer: Self-pay | Admitting: Cardiovascular Disease

## 2018-04-10 DIAGNOSIS — R0989 Other specified symptoms and signs involving the circulatory and respiratory systems: Secondary | ICD-10-CM

## 2018-04-10 DIAGNOSIS — Z01818 Encounter for other preprocedural examination: Secondary | ICD-10-CM

## 2018-04-10 DIAGNOSIS — E119 Type 2 diabetes mellitus without complications: Secondary | ICD-10-CM

## 2018-04-10 DIAGNOSIS — I739 Peripheral vascular disease, unspecified: Secondary | ICD-10-CM

## 2018-04-11 ENCOUNTER — Ambulatory Visit (HOSPITAL_BASED_OUTPATIENT_CLINIC_OR_DEPARTMENT_OTHER)
Admission: RE | Admit: 2018-04-11 | Discharge: 2018-04-11 | Disposition: A | Discharge status: Home / Self Care | Payer: Medicare Other | Source: Ambulatory Visit | Attending: Cardiovascular Disease | Admitting: Cardiovascular Disease

## 2018-04-11 ENCOUNTER — Ambulatory Visit (HOSPITAL_COMMUNITY)
Admission: RE | Admit: 2018-04-11 | Discharge: 2018-04-11 | Disposition: A | Discharge status: Home / Self Care | Payer: Medicare Other | Source: Ambulatory Visit | Attending: Cardiology | Admitting: Cardiology

## 2018-04-11 DIAGNOSIS — Z8249 Family history of ischemic heart disease and other diseases of the circulatory system: Secondary | ICD-10-CM | POA: Insufficient documentation

## 2018-04-11 DIAGNOSIS — I739 Peripheral vascular disease, unspecified: Secondary | ICD-10-CM | POA: Diagnosis not present

## 2018-04-11 DIAGNOSIS — Z01818 Encounter for other preprocedural examination: Secondary | ICD-10-CM

## 2018-04-11 DIAGNOSIS — E119 Type 2 diabetes mellitus without complications: Secondary | ICD-10-CM

## 2018-04-11 DIAGNOSIS — R0989 Other specified symptoms and signs involving the circulatory and respiratory systems: Secondary | ICD-10-CM

## 2018-04-11 LAB — MYOCARDIAL PERFUSION IMAGING
CHL CUP NUCLEAR SDS: 1
CHL CUP NUCLEAR SRS: 0
CHL CUP RESTING HR STRESS: 63 {beats}/min
LV dias vol: 73 mL (ref 46–106)
LV sys vol: 26 mL
NUC STRESS TID: 1.06
Peak HR: 116 {beats}/min
SSS: 1

## 2018-04-11 MED ORDER — TECHNETIUM TC 99M TETROFOSMIN IV KIT
32.0000 | PACK | Freq: Once | INTRAVENOUS | Status: AC | PRN
  Administered 2018-04-11: 32 via INTRAVENOUS
  Filled 2018-04-11: qty 32

## 2018-04-11 MED ORDER — TECHNETIUM TC 99M TETROFOSMIN IV KIT
10.7000 | PACK | Freq: Once | INTRAVENOUS | Status: AC | PRN
  Administered 2018-04-11: 10.7 via INTRAVENOUS
  Filled 2018-04-11: qty 11

## 2018-04-11 MED ORDER — AMINOPHYLLINE 25 MG/ML IV SOLN
75.0000 mg | Freq: Once | INTRAVENOUS | Status: AC
  Administered 2018-04-11: 75 mg via INTRAVENOUS

## 2018-04-11 MED ORDER — REGADENOSON 0.4 MG/5ML IV SOLN
0.4000 mg | Freq: Once | INTRAVENOUS | Status: AC
  Administered 2018-04-11: 0.4 mg via INTRAVENOUS

## 2018-04-28 ENCOUNTER — Telehealth: Payer: Self-pay | Admitting: Neurology

## 2018-04-28 ENCOUNTER — Other Ambulatory Visit: Payer: Self-pay | Admitting: *Deleted

## 2018-04-28 MED ORDER — NORTRIPTYLINE HCL 10 MG PO CAPS: 30.0000 mg | ORAL_CAPSULE | Freq: Every day | ORAL | 2 refills | Status: AC

## 2018-04-28 NOTE — Telephone Encounter (Signed)
Patient is calling in stating that her Nortripyline 10mg  3 pills per day refill needs to be sent to [facility] for a 90 day supply. If you need to call her it's  [phone]. Thanks!

## 2018-04-28 NOTE — Telephone Encounter (Signed)
Rx sent 

## 2018-05-03 ENCOUNTER — Encounter: Payer: Self-pay | Admitting: Obstetrics & Gynecology

## 2018-05-03 ENCOUNTER — Ambulatory Visit (INDEPENDENT_AMBULATORY_CARE_PROVIDER_SITE_OTHER): Payer: Medicare Other | Admitting: Obstetrics & Gynecology

## 2018-05-03 VITALS — BP 128/86 | Ht 64.5 in | Wt 143.4 lb

## 2018-05-03 DIAGNOSIS — Z780 Asymptomatic menopausal state: Secondary | ICD-10-CM | POA: Diagnosis not present

## 2018-05-03 DIAGNOSIS — M8589 Other specified disorders of bone density and structure, multiple sites: Secondary | ICD-10-CM

## 2018-05-03 DIAGNOSIS — N952 Postmenopausal atrophic vaginitis: Secondary | ICD-10-CM | POA: Diagnosis not present

## 2018-05-03 DIAGNOSIS — Z01419 Encounter for gynecological examination (general) (routine) without abnormal findings: Secondary | ICD-10-CM

## 2018-05-03 NOTE — Progress Notes (Signed)
Shelia Leon 02/22/1961 [id number]   History:    57 y.o. G2P2L2 Married  RP:  Established patient presenting for annual gyn exam   HPI: Menopause, well on no HRT, except for vaginal dryness causing pain with IC and low libido.  No PMB.  No pelvic pain.  Urine/BMs wnl.  Breasts normal.  BMI 24.23.  Physically active.  Health labs with Fam MD.  Past medical history,surgical history, family history and social history were all reviewed and documented in the EPIC chart.  Gynecologic History Patient's last menstrual period was 01/15/2013. Contraception: post menopausal status Last Pap: 12/2016. Results were: Negative/HPV HR neg Last mammogram: 09/2017. Results were: Negative Bone Density: 02/2017 Osteopenia T Score -2.4 Colonoscopy: 2015.  10 yr schedule  Obstetric History OB History  Gravida Para Term Preterm AB Living  2 2       2   SAB TAB Ectopic Multiple Live Births               # Outcome Date GA Lbr Len/2nd Weight Sex Delivery Anes PTL Lv  2 Para           1 Para              ROS: A ROS was performed and pertinent positives and negatives are included in the history.  GENERAL: No fevers or chills. HEENT: No change in vision, no earache, sore throat or sinus congestion. NECK: No pain or stiffness. CARDIOVASCULAR: No chest pain or pressure. No palpitations. PULMONARY: No shortness of breath, cough or wheeze. GASTROINTESTINAL: No abdominal pain, nausea, vomiting or diarrhea, melena or bright red blood per rectum. GENITOURINARY: No urinary frequency, urgency, hesitancy or dysuria. MUSCULOSKELETAL: No joint or muscle pain, no back pain, no recent trauma. DERMATOLOGIC: No rash, no itching, no lesions. ENDOCRINE: No polyuria, polydipsia, no heat or cold intolerance. No recent change in weight. HEMATOLOGICAL: No anemia or easy bruising or bleeding. NEUROLOGIC: No headache, seizures, numbness, tingling or weakness. PSYCHIATRIC: No depression, no loss of interest in normal activity or change  in sleep pattern.     Exam:   BP 128/86   Ht 5' 4.5" (1.638 m)   Wt 143 lb 6.4 oz (65 kg)   LMP 01/15/2013   BMI 24.23 kg/m   Body mass index is 24.23 kg/m.  General appearance : Well developed well nourished female. No acute distress HEENT: Eyes: no retinal hemorrhage or exudates,  Neck supple, trachea midline, no carotid bruits, no thyroidmegaly Lungs: Clear to auscultation, no rhonchi or wheezes, or rib retractions  Heart: Regular rate and rhythm, no murmurs or gallops Breast:Examined in sitting and supine position were symmetrical in appearance, no palpable masses or tenderness,  no skin retraction, no nipple inversion, no nipple discharge, no skin discoloration, no axillary or supraclavicular lymphadenopathy Abdomen: no palpable masses or tenderness, no rebound or guarding Extremities: no edema or skin discoloration or tenderness  Pelvic: Vulva: Normal             Vagina: No gross lesions or discharge  Cervix: No gross lesions or discharge  Uterus  AV, normal size, shape and consistency, non-tender and mobile  Adnexa  Without masses or tenderness  Anus: Normal   Assessment/Plan:  57 y.o. female for annual exam   1. Well female exam with routine gynecological exam Normal gynecologic exam and menopause.  Pap test July 2018 was negative with negative high-risk HPV.  Will repeat Pap test at 2 to 3 years.  Breast exam normal.  Screening  mammogram April 2019 was negative.  Health labs with family physician.  Good body mass index at 24.23.  Continue with healthy nutrition and fitness.  2. Postmenopausal Well on no hormone replacement therapy except for vaginal dryness.  No postmenopausal bleeding.  3. Post-menopausal atrophic vaginitis Pain with intercourse associated with vaginal dryness from postmenopausal atrophic vaginitis.  Decision to start on local estrogen vaginally and at the vulva.  Usage, benefits and risks reviewed with patient.  Estrogen cream a quarter of an  applicator twice a week.  Message sent to prescribe compound Estrogen cream.    4. Osteopenia of multiple sites Will schedule bone density here now.  Vitamin D supplements, calcium intake of 1.5 g/day and regular weightbearing physical activity recommended. - DG Bone Density; Future  Genia Del MD, 3:58 PM 05/03/2018

## 2018-05-04 ENCOUNTER — Encounter: Payer: Self-pay | Admitting: Obstetrics & Gynecology

## 2018-05-04 ENCOUNTER — Telehealth: Payer: Self-pay | Admitting: *Deleted

## 2018-05-04 ENCOUNTER — Encounter: Payer: Self-pay | Admitting: *Deleted

## 2018-05-04 MED ORDER — NONFORMULARY OR COMPOUNDED ITEM: 3 refills | Status: AC

## 2018-05-04 NOTE — Telephone Encounter (Signed)
-----   Message from Genia DelMarie-Lyne Lavoie, MD sent at 05/03/2018  4:23 PM EST ----- Regarding: Compound Estrogen vaginal cream Please send a prescription for Estrogen compound cream, 1/4 applicator vaginally twice a week x 1 year.

## 2018-05-04 NOTE — Patient Instructions (Signed)
1. Well female exam with routine gynecological exam Normal gynecologic exam and menopause.  Pap test July 2018 was negative with negative high-risk HPV.  Will repeat Pap test at 2 to 3 years.  Breast exam normal.  Screening mammogram April 2019 was negative.  Health labs with family physician.  Good body mass index at 24.23.  Continue with healthy nutrition and fitness.  2. Postmenopausal Well on no hormone replacement therapy except for vaginal dryness.  No postmenopausal bleeding.  3. Post-menopausal atrophic vaginitis Pain with intercourse associated with vaginal dryness from postmenopausal atrophic vaginitis.  Decision to start on local estrogen vaginally and at the vulva.  Usage, benefits and risks reviewed with patient.  Estrogen cream a quarter of an applicator twice a week.  Message sent to prescribe compound Estrogen cream.    4. Osteopenia of multiple sites Will schedule bone density here now.  Vitamin D supplements, calcium intake of 1.5 g/day and regular weightbearing physical activity recommended. - DG Bone Density; Future  Shelia Leon, it was a pleasure seeing you today!  I will inform you of your bone density results as soon as they are available.

## 2018-05-04 NOTE — Telephone Encounter (Signed)
Rx called into [facility] per patient request.

## 2018-05-04 NOTE — Telephone Encounter (Signed)
Sent my chart message to find out what pharmacy Rx should be called into.

## 2018-05-08 ENCOUNTER — Ambulatory Visit: Payer: Medicare Other | Admitting: Internal Medicine

## 2018-05-08 ENCOUNTER — Encounter: Payer: Self-pay | Admitting: Internal Medicine

## 2018-05-08 VITALS — BP 110/60 | HR 81 | Ht 64.5 in | Wt 144.0 lb

## 2018-05-08 DIAGNOSIS — E785 Hyperlipidemia, unspecified: Secondary | ICD-10-CM

## 2018-05-08 DIAGNOSIS — E119 Type 2 diabetes mellitus without complications: Secondary | ICD-10-CM

## 2018-05-08 DIAGNOSIS — R252 Cramp and spasm: Secondary | ICD-10-CM | POA: Diagnosis not present

## 2018-05-08 DIAGNOSIS — E1169 Type 2 diabetes mellitus with other specified complication: Secondary | ICD-10-CM

## 2018-05-08 LAB — POCT GLYCOSYLATED HEMOGLOBIN (HGB A1C): Hemoglobin A1C: 6.5 % — AB (ref 4.0–5.6)

## 2018-05-08 MED ORDER — METFORMIN HCL ER 500 MG PO TB24: 1000.0000 mg | ORAL_TABLET | Freq: Every day | ORAL | 3 refills | Status: DC

## 2018-05-08 NOTE — Patient Instructions (Addendum)
Please stop the regular Metformin and start Metformin ER 1000 mg 2x a day with meals.  KEEP UP THE GREAT WORK!  Please return in 4 months with your sugar log.

## 2018-05-08 NOTE — Progress Notes (Signed)
Patient ID: Orli Degrave, female   DOB: 04/23/1961, 57 y.o.   MRN: [id number]   HPI: Daneisha Surges is a 57 y.o.-year-old female, returning for follow-up for DM2, dx in ~2000, non-insulin-dependent, uncontrolled, without long term complications.  Last visit 4 months ago.  In the last year, after her HbA1c returned very elevated, she started to make changes in her diet (more veggies, less coffee) and exercise (started walking) and her sugars improved significantly.  She continues on this diet and her sugars improved further.  She is also feeling very well  Last hemoglobin A1c was: Lab Results  Component Value Date   HGBA1C 7.0 (A) 01/03/2018   HGBA1C 11.4 (H) 08/31/2017   HGBA1C 7.9 06/02/2017  08/23/2017: HbA1c 9.0% at home  Pt is on a regimen of: - Metformin 1000 mg 2X a day >> diarrhea  - has to take Imodium She was also on Januvia 100 mg in am >> stopped  b/c $. She was on Glipizide >> low CBGs.  Pt checks her sugars 1x a day: - am: 125-130 >> 89-150 >> 80-100 >>  120-130, 140 (after coffee) - 2h after b'fast: n/c >> 150-160 >> 140-142 - before lunch: 69-70's 2x a month >> 140-145 >> 120-130 - 2h after lunch: n/c >> 130-140 - before dinner: n/c >> 140 >> 180 x1 >> 120-130 - 2h after dinner: 120-135 >> 160 >> 140-150 - bedtime: n/c - nighttime: n/c Lowest sugar was 62 (not since stopping Glipizide) >> 89 >> 80s >> 100; she has hypoglycemia awareness in the 70s. Highest sugar was 180 >> 150s  Glucometer: OneTouch Ultra  Pt's meals are: - Breakfast: b'fast bar; coffee; wheat toast + cheerios + regular milk - Lunch: veggies + banana + water >> veggie drink + fruit - Dinner: brown rice + occas. Steak or fish or chicken + veggies; tacos >> salad + veggies + pickle + small piece of chicken - Snacks: cup cakes - diabetic >> fresh veggies No sodas, drinks diluted ice tea  She exercises in am before work - dancing.  -No CKD, last BUN/creatinine:  Lab Results  Component Value Date    BUN 9 02/21/2018   BUN 10 03/10/2017   CREATININE 0.76 02/21/2018   CREATININE 0.82 03/10/2017  On lisinopril 5. -+ HL;  last set of lipids: Lab Results  Component Value Date   CHOL 124 02/21/2018   HDL 53.50 02/21/2018   LDLCALC 50 02/21/2018   TRIG 106.0 02/21/2018   CHOLHDL 2 02/21/2018  On Lipitor 40. - last eye exam was in Summer 2019: No DR, + low-grade cataract.  She was given bifocal glasses. -  + numbness and tingling in her feet - whole foot.  + mm cramps (legs and abdomen).  She takes a B12 vitamin and nortryptilin.  She tried alpha lipoic acid but did not help.  Pt has FH of DM in daughter, 3x brothers.  She has a history of hypothyroidism, on levothyroxine.  Latest TSH was normal: Lab Results  Component Value Date   TSH 0.73 02/21/2018   ROS: Constitutional: no weight gain/no weight loss, no fatigue, no subjective hyperthermia, no subjective hypothermia Eyes: no blurry vision, no xerophthalmia ENT: no sore throat, no nodules palpated in neck, no dysphagia, no odynophagia, no hoarseness Cardiovascular: no CP/no SOB/no palpitations/no leg swelling Respiratory: no cough/no SOB/no wheezing Gastrointestinal: no N/no V/no D/no C/no acid reflux Musculoskeletal: no muscle aches/no joint aches Skin: no rashes, no hair loss Neurological: no tremors/+ numbness/+ tingling/no dizziness  I reviewed pt's medications, allergies, PMH, social hx, family hx, and changes were documented in the history of present illness. Otherwise, unchanged from my initial visit note.  Past Medical History:  Diagnosis Date  . Anemia   . Arthritis   . Diabetes mellitus without complication ([place])   . Hyperlipidemia   . Osteopenia 02/2017   T score -2.4 FRAX 2.3%/0.2%  . Thyroid disease    Past Surgical History:  Procedure Laterality Date  . BUNIONECTOMY Right 1987  . CERVICAL SPINE SURGERY     hardware   Social History   Socioeconomic History  . Marital status: Married    Spouse  name: Not on file  . Number of children: Not on file  . Years of education: Not on file  . Highest education level: Not on file  Occupational History  . Not on file  Social Needs  . Financial resource strain: Not on file  . Food insecurity:    Worry: Not on file    Inability: Not on file  . Transportation needs:    Medical: Not on file    Non-medical: Not on file  Tobacco Use  . Smoking status: Current Every Day Smoker    Packs/day: 0.40    Years: 34.00    Pack years: 13.60    Types: Cigarettes  . Smokeless tobacco: Never Used  . Tobacco comment: 5-7 cigs a day   Substance and Sexual Activity  . Alcohol use: No  . Drug use: No  . Sexual activity: Yes    Birth control/protection: Post-menopausal    Comment: 1st intercourse- 71, partners - 6  Lifestyle  . Physical activity:    Days per week: Not on file    Minutes per session: Not on file  . Stress: Not on file  Relationships  . Social connections:    Talks on phone: Not on file    Gets together: Not on file    Attends religious service: Not on file    Active member of club or organization: Not on file    Attends meetings of clubs or organizations: Not on file    Relationship status: Not on file  . Intimate partner violence:    Fear of current or ex partner: Not on file    Emotionally abused: Not on file    Physically abused: Not on file    Forced sexual activity: Not on file  Other Topics Concern  . Not on file  Social History Narrative   Lives with husband in a 2 story house.  She had two grown children.   She previously worked as Quarry manager, stopped working in 2011.  She has been on disability since then.      States that it is very painful going up and down stairs.    Sleeps upstairs so she only makes one trip a day up at bedtime.      High school education.   Current Outpatient Medications on File Prior to Visit  Medication Sig Dispense Refill  . Alcohol Swabs (ALCOHOL PREPS) PADS by Does not apply route.    Marland Kitchen  aspirin EC 81 MG tablet Take 81 mg by mouth daily.    Marland Kitchen atorvastatin (LIPITOR) 40 MG tablet Take 1 tablet (40 mg total) by mouth at bedtime. 90 tablet 1  . Blood Glucose Monitoring Suppl (ONE TOUCH ULTRA 2) w/Device KIT USE TO CHECK BLOOD SUGARS  EVERY DAY 1 each 0  . Cholecalciferol (VITAMIN D3) 2000 UNITS TABS Take 2,000  Units by mouth daily.    . Continuous Blood Gluc Sensor (FREESTYLE LIBRE 14 DAY SENSOR) MISC 1 each by Does not apply route every 14 (fourteen) days. Change every 2 weeks 2 each 11  . cyanocobalamin 1000 MCG tablet Take 100 mcg by mouth daily.    Marland Kitchen dicyclomine (BENTYL) 10 MG capsule Take 1 capsule by mouth 4 times daily before meals and at bedtime 360 capsule 1  . docusate sodium (COLACE) 100 MG capsule Take 100 mg by mouth 2 (two) times daily.    Marland Kitchen glucose blood (ONE TOUCH ULTRA TEST) test strip 1 each by Other route 3 (three) times daily. Use to check blood sugars three times a day Dx E11.9 100 each 3  . IBUPROFEN PO Take by mouth.    . Lancets (ONETOUCH DELICA PLUS EHMCNO70J) MISC USE AS DIRECTED TO CHECK  BLOOD SUGARS 3 TIMES DAILY 300 each 1  . levothyroxine (SYNTHROID, LEVOTHROID) 125 MCG tablet Take 1 tablet (125 mcg total) by mouth daily before breakfast. 90 tablet 1  . lisinopril (PRINIVIL,ZESTRIL) 5 MG tablet Take 1 tablet (5 mg total) by mouth daily. 90 tablet 1  . metFORMIN (GLUCOPHAGE) 1000 MG tablet Take 1 tablet (1,000 mg total) by mouth 2 (two) times daily with a meal. 180 tablet 1  . naproxen (NAPROSYN) 500 MG tablet Take 1 tablet (500 mg total) by mouth daily. 90 tablet 1  . NONFORMULARY OR COMPOUNDED ITEM Estradiol vaginal cream 0.02% 1 gram twice weekly 90 each 3  . nortriptyline (PAMELOR) 10 MG capsule Take 3 capsules (30 mg total) by mouth at bedtime. 270 capsule 2  . ONE TOUCH ULTRA TEST test strip USE AS DIRECTED TO CHECK  BLOOD SUGARS 3 TIMES DAILY 200 each 1  . tiZANidine (ZANAFLEX) 2 MG tablet Take 1 to 2 Tablets by mouth 3 times daily as needed for  muscle spasms 180 tablet 1   No current facility-administered medications on file prior to visit.    Allergies  Allergen Reactions  . Penicillins    Family History  Problem Relation Age of Onset  . Alcohol abuse Mother   . Arthritis Mother   . Heart disease Mother   . CAD Mother   . Alcohol abuse Father   . Arthritis Father   . Heart disease Father   . Diabetes Daughter   . Heart disease Sister   . Diabetes Brother   . Heart attack Brother   . Hypertension Brother   . Diabetes Brother   . Heart attack Brother   . Diabetes Brother   . Heart attack Brother   . Diabetes Brother   . Hypertension Brother   . Heart attack Sister   . Cancer Sister     PE: BP 110/60   Pulse 81   Ht 5' 4.5" (1.638 m)   Wt 144 lb (65.3 kg)   LMP 01/15/2013   SpO2 98%   BMI 24.34 kg/m  Wt Readings from Last 3 Encounters:  05/08/18 144 lb (65.3 kg)  05/03/18 143 lb 6.4 oz (65 kg)  04/11/18 146 lb (66.2 kg)   Constitutional: Normal weight, in NAD Eyes: PERRLA, EOMI, no exophthalmos ENT: moist mucous membranes, no thyromegaly, no cervical lymphadenopathy Cardiovascular: RRR, No MRG Respiratory: CTA B Gastrointestinal: abdomen soft, NT, ND, BS+ Musculoskeletal: no deformities, strength intact in all 4 Skin: moist, warm, no rashes Neurological: no tremor with outstretched hands, DTR normal in all 4  ASSESSMENT: 1. DM2, non-insulin-dependent, uncontrolled, without long term complications  2. HL  3.  Muscle cramps  PLAN:  1. Patient with long-standing, uncontrolled, type 2 diabetes, on oral antidiabetic regimen with metformin only.  She had to come off Januvia as she could not afford it anymore.  However, her sugars did not change significantly after stopping Januvia as she also started to make dietary changes and increasing activity.  At last visit sugars were at or close to goal, but she was not remember her CBGs well and she did not bring a meter or log.  HbA1c obtained then was much  better, at 7.0%.  We did not change her regimen at that time. - At this visit, sugars are excellent as she continues to improve her diet.  She snacks on fresh veggies and at lunch she drinks a vegetable juice and fruit.  However, she is having GI symptoms from metformin (diarrhea) so we discussed about switching to metformin extended release formulation.  I advised her to start with 500 mg x 2 twice daily and may need to decrease the dose to 500 mg 3 times daily if not tolerated well.  If sugars continue to stay controlled, we can reduce the dose even further.  For now, with the holidays approaching, I would prefer that she continues with 1000 mg twice a day, if possible. - I suggested to:  Patient Instructions  Please stop the regular Metformin and start Metformin ER 1000 mg 2x a day with meals.  KEEP UP THE GREAT WORK!  Please return in 4 months with your sugar log.   - today, HbA1c is 6.5% (Excellent improvement!) - continue checking sugars at different times of the day - check 1x a day, rotating checks - advised for yearly eye exams >> she is UTD - UTD with flu shot - Return to clinic in 4 mo with sugar log    2. HL - Reviewed latest lipid panel from 02/2018: All fractions at goal Lab Results  Component Value Date   CHOL 124 02/21/2018   HDL 53.50 02/21/2018   LDLCALC 50 02/21/2018   TRIG 106.0 02/21/2018   CHOLHDL 2 02/21/2018  - Continues Lipitor.  She had muscle cramps but these improved.  3.  Muscle cramps -At previous visits, I suggested a magnesium supplement +: - Muscle Calm spray ([employer] - 5$) - Theraworx cream ([place] - 20$) -However, this did not help -She is now on nortriptyline which helps with cramps.  She also continues a B12 and vitamin D supplement.  Philemon Kingdom, MD PhD [facility]

## 2018-05-22 ENCOUNTER — Telehealth: Payer: Self-pay | Admitting: Internal Medicine

## 2018-05-22 ENCOUNTER — Other Ambulatory Visit: Payer: Self-pay | Admitting: Internal Medicine

## 2018-05-22 ENCOUNTER — Other Ambulatory Visit: Payer: Self-pay

## 2018-05-22 NOTE — Telephone Encounter (Signed)
Pt instructions are to take 2 tablets (1000mg ) by mouth twice daily.  4 tablets daily for 90 days is 360 tablets total. On 05/08/2018, a 90 day Rx was sent to pharmacy for 360 tablets.  This is enough to last pt. 90 days.

## 2018-05-22 NOTE — Telephone Encounter (Signed)
Patient states that she is supposed to be taking 2 metFORMIN (GLUCOPHAGE-XR) 500 MG 24 hr twice a day (1000 mg at Breakfast and 1000 mg at [employer]inner).  Her concnern is that her Rx does not have enough pills to cover 90 days at this dosage/    Please call to advise

## 2018-05-25 ENCOUNTER — Ambulatory Visit (INDEPENDENT_AMBULATORY_CARE_PROVIDER_SITE_OTHER): Payer: Medicare Other | Admitting: Internal Medicine

## 2018-05-25 ENCOUNTER — Encounter: Payer: Self-pay | Admitting: Internal Medicine

## 2018-05-25 DIAGNOSIS — E119 Type 2 diabetes mellitus without complications: Secondary | ICD-10-CM | POA: Diagnosis not present

## 2018-05-25 MED ORDER — METFORMIN HCL ER 500 MG PO TB24: 1000.0000 mg | ORAL_TABLET | Freq: Two times a day (BID) | ORAL | 3 refills | Status: DC

## 2018-05-25 MED ORDER — METFORMIN HCL ER 500 MG PO TB24: 1000.0000 mg | ORAL_TABLET | Freq: Two times a day (BID) | ORAL | 3 refills | Status: AC

## 2018-05-25 NOTE — Patient Instructions (Signed)
You are doing great!

## 2018-05-25 NOTE — Progress Notes (Signed)
   Subjective:    Patient ID: Shelia NineRobin Leon, female    DOB: 06/25/1960, 57 y.o.   MRN: [id number]  HPI The patient is a 57 YO female coming in for follow up of her sugars. She has been seeing the endocrinologist and they have gotten her sugars under control. But they changed her medicine and did not send in the sig correctly and so the pharmacy gave her 60 per month metformin but she is supposed to be taking 2 pills BID. Endo note confirms this dosing. She denies low sugars. She is trying to be good about diet. Denies exercise.   Review of Systems  Constitutional: Negative.   HENT: Negative.   Eyes: Negative.   Respiratory: Negative for cough, chest tightness and shortness of breath.   Cardiovascular: Negative for chest pain, palpitations and leg swelling.  Gastrointestinal: Negative for abdominal distention, abdominal pain, constipation, diarrhea, nausea and vomiting.  Musculoskeletal: Negative.   Skin: Negative.   Neurological: Negative.   Psychiatric/Behavioral: Negative.       Objective:   Physical Exam Constitutional:      Appearance: She is well-developed.  HENT:     Head: Normocephalic and atraumatic.  Neck:     Musculoskeletal: Normal range of motion.  Cardiovascular:     Rate and Rhythm: Normal rate and regular rhythm.  Pulmonary:     Effort: Pulmonary effort is normal. No respiratory distress.     Breath sounds: Normal breath sounds. No wheezing or rales.  Abdominal:     General: Bowel sounds are normal. There is no distension.     Palpations: Abdomen is soft.     Tenderness: There is no abdominal tenderness. There is no rebound.  Skin:    General: Skin is warm and dry.  Neurological:     Mental Status: She is alert and oriented to person, place, and time.     Coordination: Coordination normal.    Vitals:   05/25/18 0818  BP: 100/60  Pulse: 62  Temp: 97.7 F (36.5 C)  TempSrc: Oral  SpO2: 99%  Weight: 143 lb (64.9 kg)  Height: 5' 4.5" (1.638 m)        Assessment & Plan:

## 2018-05-26 NOTE — Assessment & Plan Note (Addendum)
Seeing endocrinology and HgA1c is at goal now. Congratulated her on this work as she has made significant changes to diet. Sent in metformin 2 pills BID extended release as this is her regimen 3 month supply.

## 2018-06-01 ENCOUNTER — Telehealth: Payer: Self-pay | Admitting: Internal Medicine

## 2018-06-01 NOTE — Telephone Encounter (Signed)
Copied from CRM [id number]. Topic: Quick Communication - See Telephone Encounter >> Jun 01, 2018 11:33 AM Angela NevinWilliams, Candice N wrote: CRM for notification. See Telephone encounter for: 06/01/18.  Onalee Huaavid, from [facility], called requesting clarity on patients metFORMIN (GLUCOPHAGE-XR) 500 MG 24 hr tablet. Onalee HuaDavid states that his records indicate patient had previously been prescribed 1000 mg of this medication and would like to clarify that the new dose is correct. Please advise.   Cb# [phone] ONG#[id number]

## 2018-06-01 NOTE — Telephone Encounter (Signed)
Call to pharmacy- verified as written

## 2018-06-19 ENCOUNTER — Ambulatory Visit (INDEPENDENT_AMBULATORY_CARE_PROVIDER_SITE_OTHER): Payer: Medicare Other

## 2018-06-19 ENCOUNTER — Other Ambulatory Visit: Payer: Self-pay | Admitting: Obstetrics & Gynecology

## 2018-06-19 DIAGNOSIS — M8589 Other specified disorders of bone density and structure, multiple sites: Secondary | ICD-10-CM

## 2018-06-19 DIAGNOSIS — Z780 Asymptomatic menopausal state: Secondary | ICD-10-CM | POA: Diagnosis not present

## 2018-06-29 NOTE — Telephone Encounter (Signed)
Patient informed with dexa results.  

## 2018-07-06 ENCOUNTER — Other Ambulatory Visit: Payer: Self-pay | Admitting: Internal Medicine

## 2018-08-11 ENCOUNTER — Encounter: Payer: Self-pay | Admitting: Internal Medicine

## 2018-08-12 ENCOUNTER — Other Ambulatory Visit: Payer: Self-pay | Admitting: Internal Medicine

## 2018-09-06 ENCOUNTER — Ambulatory Visit: Payer: Medicare Other | Admitting: Internal Medicine

## 2019-03-07 ENCOUNTER — Encounter: Payer: Self-pay | Admitting: Gynecology

## 2019-05-07 ENCOUNTER — Encounter: Payer: Self-pay | Admitting: Obstetrics & Gynecology

## 2019-05-07 DIAGNOSIS — Z0289 Encounter for other administrative examinations: Secondary | ICD-10-CM

## 2019-09-12 ENCOUNTER — Encounter: Payer: Self-pay | Admitting: Internal Medicine

## 2019-10-02 ENCOUNTER — Other Ambulatory Visit: Payer: Self-pay | Admitting: Internal Medicine

## 2019-10-02 DIAGNOSIS — Z1231 Encounter for screening mammogram for malignant neoplasm of breast: Secondary | ICD-10-CM
